# Patient Record
Sex: Female | Born: 1970 | Race: White | Hispanic: No | Marital: Married | Smoking: Never smoker
Health system: Southern US, Community
[De-identification: ages and names within clinical notes are randomized; demographics above are authoritative.]

## PROBLEM LIST (undated history)

## (undated) DIAGNOSIS — R011 Cardiac murmur, unspecified: Secondary | ICD-10-CM

## (undated) DIAGNOSIS — B370 Candidal stomatitis: Secondary | ICD-10-CM

## (undated) DIAGNOSIS — B373 Candidiasis of vulva and vagina: Secondary | ICD-10-CM

## (undated) DIAGNOSIS — B3731 Acute candidiasis of vulva and vagina: Secondary | ICD-10-CM

## (undated) DIAGNOSIS — R05 Cough: Secondary | ICD-10-CM

## (undated) DIAGNOSIS — Z20828 Contact with and (suspected) exposure to other viral communicable diseases: Secondary | ICD-10-CM

## (undated) DIAGNOSIS — B009 Herpesviral infection, unspecified: Secondary | ICD-10-CM

## (undated) DIAGNOSIS — R059 Cough, unspecified: Secondary | ICD-10-CM

## (undated) DIAGNOSIS — J329 Chronic sinusitis, unspecified: Secondary | ICD-10-CM

## (undated) DIAGNOSIS — J310 Chronic rhinitis: Secondary | ICD-10-CM

## (undated) HISTORY — DX: Cough: R05

## (undated) HISTORY — DX: Chronic sinusitis, unspecified: J32.9

## (undated) HISTORY — DX: Candidiasis of vulva and vagina: B37.3

## (undated) HISTORY — DX: Cough, unspecified: R05.9

## (undated) HISTORY — DX: Herpesviral infection, unspecified: B00.9

## (undated) HISTORY — DX: Contact with and (suspected) exposure to other viral communicable diseases: Z20.828

## (undated) HISTORY — DX: Acute candidiasis of vulva and vagina: B37.31

## (undated) HISTORY — DX: Candidal stomatitis: B37.0

## (undated) HISTORY — DX: Cardiac murmur, unspecified: R01.1

## (undated) HISTORY — DX: Chronic rhinitis: J31.0

---

## 1999-02-18 ENCOUNTER — Emergency Department (HOSPITAL_COMMUNITY): Admission: EM | Admit: 1999-02-18 | Discharge: 1999-02-18 | Payer: Self-pay | Admitting: Internal Medicine

## 2000-12-15 ENCOUNTER — Other Ambulatory Visit: Admission: RE | Admit: 2000-12-15 | Discharge: 2000-12-15 | Payer: Self-pay | Admitting: Obstetrics and Gynecology

## 2001-05-16 ENCOUNTER — Ambulatory Visit (HOSPITAL_COMMUNITY): Admission: AD | Admit: 2001-05-16 | Discharge: 2001-05-16 | Payer: Self-pay | Admitting: Obstetrics and Gynecology

## 2001-07-15 ENCOUNTER — Inpatient Hospital Stay (HOSPITAL_COMMUNITY): Admission: AD | Admit: 2001-07-15 | Discharge: 2001-07-15 | Payer: Self-pay | Admitting: Obstetrics and Gynecology

## 2001-07-17 ENCOUNTER — Inpatient Hospital Stay (HOSPITAL_COMMUNITY): Admission: AD | Admit: 2001-07-17 | Discharge: 2001-07-21 | Payer: Self-pay | Admitting: Obstetrics and Gynecology

## 2001-07-17 ENCOUNTER — Encounter (INDEPENDENT_AMBULATORY_CARE_PROVIDER_SITE_OTHER): Payer: Self-pay | Admitting: Specialist

## 2001-07-22 ENCOUNTER — Encounter: Admission: RE | Admit: 2001-07-22 | Discharge: 2001-08-21 | Payer: Self-pay | Admitting: Obstetrics and Gynecology

## 2001-08-17 ENCOUNTER — Other Ambulatory Visit: Admission: RE | Admit: 2001-08-17 | Discharge: 2001-08-17 | Payer: Self-pay | Admitting: Obstetrics and Gynecology

## 2001-09-21 ENCOUNTER — Encounter: Admission: RE | Admit: 2001-09-21 | Discharge: 2001-10-21 | Payer: Self-pay | Admitting: Obstetrics and Gynecology

## 2002-08-31 ENCOUNTER — Other Ambulatory Visit: Admission: RE | Admit: 2002-08-31 | Discharge: 2002-08-31 | Payer: Self-pay | Admitting: Obstetrics and Gynecology

## 2003-02-28 ENCOUNTER — Ambulatory Visit (HOSPITAL_COMMUNITY): Admission: RE | Admit: 2003-02-28 | Discharge: 2003-02-28 | Payer: Self-pay | Admitting: Gynecology

## 2003-03-06 ENCOUNTER — Encounter: Admission: RE | Admit: 2003-03-06 | Discharge: 2003-03-06 | Payer: Self-pay | Admitting: Gynecology

## 2003-05-23 ENCOUNTER — Inpatient Hospital Stay (HOSPITAL_COMMUNITY): Admission: AD | Admit: 2003-05-23 | Discharge: 2003-05-26 | Payer: Self-pay | Admitting: Gynecology

## 2003-06-04 ENCOUNTER — Ambulatory Visit (HOSPITAL_COMMUNITY): Admission: RE | Admit: 2003-06-04 | Discharge: 2003-06-04 | Payer: Self-pay | Admitting: Orthopedic Surgery

## 2003-06-04 ENCOUNTER — Encounter: Payer: Self-pay | Admitting: Gynecology

## 2003-06-28 ENCOUNTER — Other Ambulatory Visit: Admission: RE | Admit: 2003-06-28 | Discharge: 2003-06-28 | Payer: Self-pay | Admitting: Gynecology

## 2003-10-09 ENCOUNTER — Emergency Department (HOSPITAL_COMMUNITY): Admission: EM | Admit: 2003-10-09 | Discharge: 2003-10-09 | Payer: Self-pay | Admitting: Emergency Medicine

## 2003-11-17 HISTORY — PX: NASAL SINUS SURGERY: SHX719

## 2004-03-31 ENCOUNTER — Emergency Department (HOSPITAL_COMMUNITY): Admission: EM | Admit: 2004-03-31 | Discharge: 2004-03-31 | Payer: Self-pay | Admitting: Emergency Medicine

## 2004-05-22 ENCOUNTER — Ambulatory Visit (HOSPITAL_COMMUNITY): Admission: RE | Admit: 2004-05-22 | Discharge: 2004-05-22 | Payer: Self-pay | Admitting: Family Medicine

## 2004-06-30 ENCOUNTER — Encounter: Payer: Self-pay | Admitting: Internal Medicine

## 2004-07-04 ENCOUNTER — Other Ambulatory Visit: Admission: RE | Admit: 2004-07-04 | Discharge: 2004-07-04 | Payer: Self-pay | Admitting: Gynecology

## 2004-08-08 ENCOUNTER — Encounter: Admission: RE | Admit: 2004-08-08 | Discharge: 2004-08-08 | Payer: Self-pay | Admitting: Otolaryngology

## 2005-02-05 ENCOUNTER — Other Ambulatory Visit: Admission: RE | Admit: 2005-02-05 | Discharge: 2005-02-05 | Payer: Self-pay | Admitting: Obstetrics & Gynecology

## 2006-04-27 ENCOUNTER — Ambulatory Visit (HOSPITAL_COMMUNITY): Admission: RE | Admit: 2006-04-27 | Discharge: 2006-04-27 | Payer: Self-pay | Admitting: Family Medicine

## 2006-05-28 ENCOUNTER — Ambulatory Visit (HOSPITAL_COMMUNITY): Admission: RE | Admit: 2006-05-28 | Discharge: 2006-05-28 | Payer: Self-pay | Admitting: Family Medicine

## 2007-08-01 ENCOUNTER — Encounter: Payer: Self-pay | Admitting: Internal Medicine

## 2007-08-17 ENCOUNTER — Encounter: Payer: Self-pay | Admitting: Internal Medicine

## 2007-08-23 ENCOUNTER — Encounter: Payer: Self-pay | Admitting: Internal Medicine

## 2007-08-29 ENCOUNTER — Encounter: Admission: RE | Admit: 2007-08-29 | Discharge: 2007-08-29 | Payer: Self-pay | Admitting: Allergy and Immunology

## 2007-08-29 ENCOUNTER — Encounter: Payer: Self-pay | Admitting: Internal Medicine

## 2007-10-03 ENCOUNTER — Encounter: Payer: Self-pay | Admitting: Internal Medicine

## 2007-10-17 ENCOUNTER — Encounter: Payer: Self-pay | Admitting: Internal Medicine

## 2007-10-20 ENCOUNTER — Encounter: Payer: Self-pay | Admitting: Internal Medicine

## 2007-12-08 ENCOUNTER — Encounter: Payer: Self-pay | Admitting: Internal Medicine

## 2007-12-26 ENCOUNTER — Ambulatory Visit: Payer: Self-pay | Admitting: Internal Medicine

## 2007-12-26 DIAGNOSIS — R011 Cardiac murmur, unspecified: Secondary | ICD-10-CM

## 2007-12-26 DIAGNOSIS — R05 Cough: Secondary | ICD-10-CM

## 2007-12-30 ENCOUNTER — Telehealth: Payer: Self-pay | Admitting: Internal Medicine

## 2008-01-25 ENCOUNTER — Telehealth: Payer: Self-pay | Admitting: Internal Medicine

## 2008-05-21 ENCOUNTER — Telehealth: Payer: Self-pay | Admitting: Internal Medicine

## 2008-06-22 ENCOUNTER — Encounter (INDEPENDENT_AMBULATORY_CARE_PROVIDER_SITE_OTHER): Payer: Self-pay | Admitting: Obstetrics and Gynecology

## 2008-06-22 ENCOUNTER — Ambulatory Visit: Payer: Self-pay | Admitting: Surgery

## 2008-06-22 ENCOUNTER — Ambulatory Visit (HOSPITAL_COMMUNITY): Admission: RE | Admit: 2008-06-22 | Discharge: 2008-06-22 | Payer: Self-pay | Admitting: Obstetrics and Gynecology

## 2008-09-27 ENCOUNTER — Telehealth (INDEPENDENT_AMBULATORY_CARE_PROVIDER_SITE_OTHER): Payer: Self-pay | Admitting: *Deleted

## 2008-10-09 ENCOUNTER — Telehealth (INDEPENDENT_AMBULATORY_CARE_PROVIDER_SITE_OTHER): Payer: Self-pay | Admitting: *Deleted

## 2008-10-25 ENCOUNTER — Ambulatory Visit (HOSPITAL_COMMUNITY): Admission: RE | Admit: 2008-10-25 | Discharge: 2008-10-25 | Payer: Self-pay | Admitting: Internal Medicine

## 2008-10-25 ENCOUNTER — Encounter: Payer: Self-pay | Admitting: Internal Medicine

## 2008-11-13 ENCOUNTER — Encounter: Payer: Self-pay | Admitting: Pulmonary Disease

## 2008-11-19 ENCOUNTER — Telehealth: Payer: Self-pay | Admitting: Internal Medicine

## 2008-11-30 ENCOUNTER — Telehealth (INDEPENDENT_AMBULATORY_CARE_PROVIDER_SITE_OTHER): Payer: Self-pay | Admitting: *Deleted

## 2008-12-03 ENCOUNTER — Ambulatory Visit: Payer: Self-pay | Admitting: Internal Medicine

## 2008-12-03 DIAGNOSIS — J302 Other seasonal allergic rhinitis: Secondary | ICD-10-CM

## 2008-12-03 DIAGNOSIS — J3089 Other allergic rhinitis: Secondary | ICD-10-CM

## 2008-12-04 ENCOUNTER — Telehealth (INDEPENDENT_AMBULATORY_CARE_PROVIDER_SITE_OTHER): Payer: Self-pay | Admitting: *Deleted

## 2008-12-04 DIAGNOSIS — J019 Acute sinusitis, unspecified: Secondary | ICD-10-CM | POA: Insufficient documentation

## 2009-01-23 ENCOUNTER — Telehealth: Payer: Self-pay | Admitting: Internal Medicine

## 2009-01-24 ENCOUNTER — Ambulatory Visit: Payer: Self-pay | Admitting: Internal Medicine

## 2009-02-15 DIAGNOSIS — J329 Chronic sinusitis, unspecified: Secondary | ICD-10-CM | POA: Insufficient documentation

## 2009-02-20 ENCOUNTER — Ambulatory Visit: Payer: Self-pay | Admitting: Internal Medicine

## 2009-02-21 ENCOUNTER — Telehealth (INDEPENDENT_AMBULATORY_CARE_PROVIDER_SITE_OTHER): Payer: Self-pay | Admitting: *Deleted

## 2009-02-25 ENCOUNTER — Ambulatory Visit: Payer: Self-pay | Admitting: Internal Medicine

## 2009-02-27 ENCOUNTER — Telehealth (INDEPENDENT_AMBULATORY_CARE_PROVIDER_SITE_OTHER): Payer: Self-pay | Admitting: *Deleted

## 2009-02-27 ENCOUNTER — Ambulatory Visit: Payer: Self-pay | Admitting: Internal Medicine

## 2009-02-28 ENCOUNTER — Ambulatory Visit: Payer: Self-pay | Admitting: Internal Medicine

## 2009-03-04 ENCOUNTER — Ambulatory Visit: Payer: Self-pay | Admitting: Internal Medicine

## 2009-03-05 ENCOUNTER — Telehealth (INDEPENDENT_AMBULATORY_CARE_PROVIDER_SITE_OTHER): Payer: Self-pay | Admitting: *Deleted

## 2009-03-06 ENCOUNTER — Telehealth (INDEPENDENT_AMBULATORY_CARE_PROVIDER_SITE_OTHER): Payer: Self-pay | Admitting: *Deleted

## 2009-03-08 ENCOUNTER — Telehealth: Payer: Self-pay | Admitting: Internal Medicine

## 2009-03-08 ENCOUNTER — Ambulatory Visit: Payer: Self-pay | Admitting: Internal Medicine

## 2009-03-11 ENCOUNTER — Ambulatory Visit: Payer: Self-pay | Admitting: Internal Medicine

## 2009-03-14 ENCOUNTER — Ambulatory Visit: Payer: Self-pay | Admitting: Internal Medicine

## 2009-03-26 ENCOUNTER — Ambulatory Visit: Payer: Self-pay | Admitting: Internal Medicine

## 2009-03-27 ENCOUNTER — Telehealth: Payer: Self-pay | Admitting: Internal Medicine

## 2009-04-16 ENCOUNTER — Ambulatory Visit: Payer: Self-pay | Admitting: Internal Medicine

## 2009-04-23 ENCOUNTER — Ambulatory Visit: Payer: Self-pay | Admitting: Internal Medicine

## 2009-04-29 ENCOUNTER — Telehealth (INDEPENDENT_AMBULATORY_CARE_PROVIDER_SITE_OTHER): Payer: Self-pay | Admitting: *Deleted

## 2009-05-16 ENCOUNTER — Ambulatory Visit: Payer: Self-pay | Admitting: Internal Medicine

## 2009-06-03 ENCOUNTER — Telehealth (INDEPENDENT_AMBULATORY_CARE_PROVIDER_SITE_OTHER): Payer: Self-pay | Admitting: *Deleted

## 2009-07-18 ENCOUNTER — Telehealth (INDEPENDENT_AMBULATORY_CARE_PROVIDER_SITE_OTHER): Payer: Self-pay | Admitting: *Deleted

## 2009-07-31 ENCOUNTER — Ambulatory Visit: Payer: Self-pay | Admitting: Internal Medicine

## 2009-08-06 ENCOUNTER — Telehealth (INDEPENDENT_AMBULATORY_CARE_PROVIDER_SITE_OTHER): Payer: Self-pay | Admitting: *Deleted

## 2009-08-26 ENCOUNTER — Telehealth: Payer: Self-pay | Admitting: Internal Medicine

## 2009-08-27 ENCOUNTER — Telehealth: Payer: Self-pay | Admitting: Internal Medicine

## 2009-09-16 ENCOUNTER — Telehealth (INDEPENDENT_AMBULATORY_CARE_PROVIDER_SITE_OTHER): Payer: Self-pay | Admitting: *Deleted

## 2009-10-07 ENCOUNTER — Telehealth (INDEPENDENT_AMBULATORY_CARE_PROVIDER_SITE_OTHER): Payer: Self-pay | Admitting: *Deleted

## 2009-11-13 ENCOUNTER — Telehealth (INDEPENDENT_AMBULATORY_CARE_PROVIDER_SITE_OTHER): Payer: Self-pay | Admitting: *Deleted

## 2009-12-02 ENCOUNTER — Telehealth (INDEPENDENT_AMBULATORY_CARE_PROVIDER_SITE_OTHER): Payer: Self-pay | Admitting: *Deleted

## 2009-12-23 ENCOUNTER — Telehealth (INDEPENDENT_AMBULATORY_CARE_PROVIDER_SITE_OTHER): Payer: Self-pay | Admitting: *Deleted

## 2010-02-13 ENCOUNTER — Ambulatory Visit (HOSPITAL_COMMUNITY): Admission: RE | Admit: 2010-02-13 | Discharge: 2010-02-13 | Payer: Self-pay | Admitting: Family Medicine

## 2010-02-25 ENCOUNTER — Ambulatory Visit: Payer: Self-pay | Admitting: Internal Medicine

## 2010-02-26 ENCOUNTER — Ambulatory Visit: Payer: Self-pay | Admitting: Internal Medicine

## 2010-03-19 ENCOUNTER — Telehealth: Payer: Self-pay | Admitting: Internal Medicine

## 2010-03-24 ENCOUNTER — Telehealth: Payer: Self-pay | Admitting: Internal Medicine

## 2010-03-28 ENCOUNTER — Telehealth: Payer: Self-pay | Admitting: Internal Medicine

## 2010-04-02 ENCOUNTER — Telehealth: Payer: Self-pay | Admitting: Internal Medicine

## 2010-04-08 ENCOUNTER — Telehealth (INDEPENDENT_AMBULATORY_CARE_PROVIDER_SITE_OTHER): Payer: Self-pay | Admitting: *Deleted

## 2010-05-09 ENCOUNTER — Telehealth (INDEPENDENT_AMBULATORY_CARE_PROVIDER_SITE_OTHER): Payer: Self-pay | Admitting: *Deleted

## 2010-05-22 ENCOUNTER — Telehealth (INDEPENDENT_AMBULATORY_CARE_PROVIDER_SITE_OTHER): Payer: Self-pay | Admitting: *Deleted

## 2010-06-06 ENCOUNTER — Telehealth (INDEPENDENT_AMBULATORY_CARE_PROVIDER_SITE_OTHER): Payer: Self-pay | Admitting: *Deleted

## 2010-06-16 ENCOUNTER — Ambulatory Visit: Payer: Self-pay | Admitting: Internal Medicine

## 2010-06-19 ENCOUNTER — Ambulatory Visit: Payer: Self-pay | Admitting: Internal Medicine

## 2010-06-20 ENCOUNTER — Telehealth (INDEPENDENT_AMBULATORY_CARE_PROVIDER_SITE_OTHER): Payer: Self-pay | Admitting: *Deleted

## 2010-07-30 ENCOUNTER — Telehealth (INDEPENDENT_AMBULATORY_CARE_PROVIDER_SITE_OTHER): Payer: Self-pay | Admitting: *Deleted

## 2010-11-19 ENCOUNTER — Telehealth: Payer: Self-pay | Admitting: Internal Medicine

## 2010-12-03 ENCOUNTER — Ambulatory Visit: Payer: Self-pay | Admitting: Internal Medicine

## 2010-12-06 ENCOUNTER — Encounter: Payer: Self-pay | Admitting: General Surgery

## 2010-12-07 ENCOUNTER — Encounter: Payer: Self-pay | Admitting: Otolaryngology

## 2010-12-16 NOTE — Progress Notes (Signed)
Summary: yeast infection-LMTCB  Phone Note Call from Patient   Caller: Patient Call For: young Summary of Call: pt have yeast infection would like something called to pharmacy. would also like samples of [facility]var [facility] -  Initial call taken by: Rickard Patience,  Apr 02, 2010 11:18 AM  Follow-up for Phone Call        [facility]Carron Curie CMA  Apr 02, 2010 11:40 AM  pt states she always gets a yeast infection after taking abx and states she has one now from the doxy and augmentin given by CY. Pt requestign rx for this. Please advise Carron Curie CMA  Apr 02, 2010 11:49 AM allergies: walnut, sulfa  Additional Follow-up for Phone Call Additional follow up Details #1::        per CY---ok for pt to have diflucan 150mg   #7  one daily with no refills---this has been sent to pts pharmacy and pt is aware Randell Loop CMA  Apr 02, 2010 2:49 PM     New/Updated Medications: FLUCONAZOLE 150 MG TABS (FLUCONAZOLE) take one tablet by mouth once daily Prescriptions: FLUCONAZOLE 150 MG TABS (FLUCONAZOLE) take one tablet by mouth once daily  #7 x 0   Entered by:   Randell Loop CMA   Authorized by:   Waymon Budge MD   Signed by:   Randell Loop CMA on 04/02/2010   Method used:   Electronically to        [facility] [address] [phone]* (retail)       [address] [place], [place]  [address]       Ph: [phone]       Fax: [phone]   RxID:   [id number]

## 2010-12-16 NOTE — Miscellaneous (Signed)
Summary: Injection record/[facility]  Injection record/[facility]   Imported By: Sherian Rein 04/08/2010 12:21:12  _____________________________________________________________________  External Attachment:    Type:   Image     Comment:   External Document

## 2010-12-16 NOTE — Progress Notes (Signed)
Summary: samples  Phone Note Call from Patient Call back at Home Phone [phone]   Caller: Patient Call For: young Reason for Call: Talk to Nurse Summary of Call: nasanex, astepro, proair or Qvar - Can pt get samples of any of these?  Initial call taken by: Eugene Gavia,  July 30, 2010 11:16 AM  Follow-up for Phone Call        Last OV 8.4.11, Pending OV 10.10.11 Dr. Maple Hudson, pls advise if ok to give samples if available.  Thanks! Gweneth Dimitri RN  July 30, 2010 11:26 AM    Ok to give one of each if we have any; also if patient is having trouble affording meds please suggest assistance programs to her.Reynaldo Minium CMA  July 30, 2010 12:06 PM   Additional Follow-up for Phone Call Additional follow up Details #1::        Per [facility], she does not know of pt assistance program for nasonex and astepro but there is one for proair and qvar.    Called, spoke with pt.  Pt states she does not qualify for pt assistance programs.  Informed her 1 sample of each left at front to pick up.  She verbalized understanding.  Proair: Lot # [id number], Exp Date 09/2011 Qvar 80: Lot # [id number], Exp Date 02/14/2011 Nasonex: Lot # [id number], Exp Date 03/2012 Astepro: Lot # [id number]  Exp Date 03/2011 Additional Follow-up by: Gweneth Dimitri RN,  July 30, 2010 12:18 PM

## 2010-12-16 NOTE — Progress Notes (Signed)
Summary: sample of Qvar  Phone Note Call from Patient Call back at [phone]   Caller: Patient Call For: Young Reason for Call: Talk to Nurse Summary of Call: Sample - Qvar inhaler Initial call taken by: Eugene Gavia,  December 23, 2009 9:14 AM  Follow-up for Phone Call        Patient is aware 1 sample of Qvar 40 left for her to pick up. Michel Bickers [facility]  December 23, 2009 11:59 AM

## 2010-12-16 NOTE — Progress Notes (Signed)
Summary: talk to nurse  Phone Note Call from Patient Call back at Home Phone [phone]   Caller: Patient Call For: young Summary of Call: Wants to know if she could take a steroid pack for her congestion. Initial call taken by: Darletta Moll,  June 20, 2010 8:41 AM  Follow-up for Phone Call        [facility] Vernie Murders  June 20, 2010 8:53 AM   Spoke with pt.  She states that when she was seen yesterday by Dr Maple Hudson, she forgot to ask if he thought maybe a "steroid pack" would help with her symptoms.  Please advise thanks allergic to sulfa Follow-up by: Vernie Murders,  June 20, 2010 8:57 AM  Additional Follow-up for Phone Call Additional follow up Details #1::        I don't advise systemic steroid for this. Additional Follow-up by: Waymon Budge MD,  June 20, 2010 9:39 AM    Additional Follow-up for Phone Call Additional follow up Details #2::    Spoke with pt and notified of recs per Dr Maple Hudson.  Pt verbalized understanding. Follow-up by: Vernie Murders,  June 20, 2010 9:41 AM

## 2010-12-16 NOTE — Miscellaneous (Signed)
Summary: Injection Orders / [facility]    Injection Orders / [facility]    Imported By: Lennie Odor 04/15/2010 15:09:09  _____________________________________________________________________  External Attachment:    Type:   Image     Comment:   External Document

## 2010-12-16 NOTE — Progress Notes (Signed)
Summary: still breathing issues- Increase qvar to 80 mcg  Phone Note Call from Patient Call back at Home Phone [phone]   Caller: Patient Call For: young Summary of Call: pt said she is still not right with her breathing should she come in or just a different med [facility] [place] Initial call taken by: Lacinda Axon,  Apr 08, 2010 11:59 AM  Follow-up for Phone Call        called and spoke with pt.  pt states she is still not feeling 100% better.  Pt states she still has chest congestion and has difficulty coughing up sputum.  Pt states it "collects in the back of her throat" and will occ get up sticky clear to white sputum.  Pt also c/o tightness in chest and states "chest almost feels achy."  Pt states her breathing is not back to its normal baseline.  Pt wanted to know if CY would like to see her for an OV or if she can try a different medication to help with these symptoms.  Please advise.  Thanks.  Aundra Millet Reynolds LPN  Apr 08, 2010 12:49 PM  ALLERGIES:  Vic Blackbird.  Additional Follow-up for Phone Call Additional follow up Details #1::        1) This may be due to Spring pollens lingering, but it could also be a low grade viral tracheobronchitis since she is always around small children. 2) In either case it may help to increase her Qvar from -40 to -80. Please offer to script her for Qvar 80, # 1, 2 puffs and rinse well, twice daily. Ref as needed. 3) The same symptoms may also come from her hiatal hernia with mild recurrent reflux. she was considering surgery for that??? Additional Follow-up by: Waymon Budge MD,  Apr 08, 2010 1:08 PM    Additional Follow-up for Phone Call Additional follow up Details #2::    Spoke with pt and notified of all of the above recs per Dr Maple Hudson.  Pt verbalized understanding and would like to go ahead and try the qvar 80.  Rx was sent to pharm.  She also states that she is considering surgery for [facility] and "this is in the works now".   Follow-up  by: Vernie Murders,  Apr 08, 2010 1:45 PM  New/Updated Medications: QVAR 80 MCG/ACT AERS (BECLOMETHASONE DIPROPIONATE) 2 puffs two times a day and rinse well after each use Prescriptions: QVAR 80 MCG/ACT AERS (BECLOMETHASONE DIPROPIONATE) 2 puffs two times a day and rinse well after each use  #1 x 11   Entered by:   Vernie Murders   Authorized by:   Waymon Budge MD   Signed by:   Vernie Murders on 04/08/2010   Method used:   Electronically to        [facility] [address] [phone]* (retail)       [address], [place]  [address]       Ph: [phone]       Fax: [phone]   RxID:   [id number]

## 2010-12-16 NOTE — Progress Notes (Signed)
Summary: samples  Phone Note Call from Patient   Caller: Patient Call For: young Summary of Call: pt requests samples of QVAR and NASONEX. # to call today is [phone] Initial call taken by: Tivis Ringer, CNA,  May 22, 2010 11:54 AM  Follow-up for Phone Call        1 of each sample was left up front.  Pt aware. Follow-up by: Vernie Murders,  May 22, 2010 2:13 PM

## 2010-12-16 NOTE — Progress Notes (Signed)
Summary: still sick  Phone Note Call from Patient Call back at [facility] Phone [phone]   Caller: Patient Call For: young Reason for Call: Talk to Nurse Summary of Call: pt has one day of doxycycline left, actually feels worse.  Sinus feels packed, thick , colored mucous, sob, cough w/ some plegm.  Feels like it is stuck to her throat.  Chest really tight. [facility] [place] Initial call taken by: Eugene Gavia,  Mar 24, 2010 8:14 AM  Follow-up for Phone Call        Pt states she feels worse since starting abx.  She states she has productive cough and the mucus is now green, chest tightness, and increased SOB. Pt has one day left of doxy course. Please advise. Carron Curie CMA  Mar 24, 2010 8:44 AM allergies: walnut, sulfa  Additional Follow-up for Phone Call Additional follow up Details #1::        Offer augmentin 875 mg, # 14, 1 two times a day after meals. Mucinex Sudafed-PE (otc decongestant) If these don't help she should be seen. Additional Follow-up by: Waymon Budge MD,  Mar 24, 2010 9:07 AM    Additional Follow-up for Phone Call Additional follow up Details #2::    pt advised of recs, rx sent.Carron Curie CMA  Mar 24, 2010 9:13 AM   New/Updated Medications: AUGMENTIN 875-125 MG TABS (AMOXICILLIN-POT CLAVULANATE) Take 1 tablet by mouth two times a day Prescriptions: AUGMENTIN 875-125 MG TABS (AMOXICILLIN-POT CLAVULANATE) Take 1 tablet by mouth two times a day  #14 x 0   Entered by:   Carron Curie CMA   Authorized by:   Waymon Budge MD   Signed by:   Carron Curie CMA on 03/24/2010   Method used:   Electronically to        [facility] [address] [phone]* (retail)       [address], [place]  [address]       Ph: [phone]       Fax: [phone]   RxID:   [id number]

## 2010-12-16 NOTE — Progress Notes (Signed)
Summary: samples req  Phone Note Call from Patient Call back at Home Phone [phone]   Caller: Patient Call For: young Summary of Call: pt requests samples of: qvar, nasonex or astepro nasal spray, and proair.  Initial call taken by: Tivis Ringer, CNA,  Mar 28, 2010 12:22 PM  Follow-up for Phone Call        only samples available are of astepro so i left a smple at front for pt. pt aware.Carron Curie CMA  Mar 28, 2010 12:48 PM

## 2010-12-16 NOTE — Progress Notes (Signed)
Summary: nasal drainage  Phone Note Call from Patient   Caller: Patient Call For: young Summary of Call: has green nasal drainage think she need antibiotic pharmacy [facility] [place] Initial call taken by: Rickard Patience,  March 06, 2009 9:15 AM  Follow-up for Phone Call        Pt came to pick up Qvar this am.  Pt thinks she needs abx d/t green drainage.  Please advise Follow-up by: Cloyde Reams RN,  March 06, 2009 9:22 AM  Additional Follow-up for Phone Call Additional follow up Details #1::        Please offer Amoxacillin 500 mg, # 21, 1 three times a day  Additional Follow-up by: Waymon Budge MD,  March 06, 2009 2:22 PM    Additional Follow-up for Phone Call Additional follow up Details #2::    Spoke with pt and advised rx for abx will be sent to pharmacy.  Follow-up by: Vernie Murders,  March 06, 2009 2:40 PM  New/Updated Medications: AMOXICILLIN 500 MG CAPS (AMOXICILLIN) 1 three times a day   Prescriptions: AMOXICILLIN 500 MG CAPS (AMOXICILLIN) 1 three times a day  #21 x 0   Entered by:   Vernie Murders   Authorized by:   Waymon Budge MD   Signed by:   Vernie Murders on 03/06/2009   Method used:   Electronically to        [facility] [address] [phone]* (retail)       [address] [place]  [address]       Ph: [phone]       Fax: [phone]   RxID:   [id number]

## 2010-12-16 NOTE — Progress Notes (Signed)
Summary: samples  Phone Note Call from Patient Call back at Home Phone [phone]   Caller: Patient Call For: YOUNG Summary of Call: pt requests samples of QVAR, ASTEPRO NASEL SPRAY and NASONEX. Pt also requests to have a med list sent to her so that she can get refills of her meds through mail-order.  Initial call taken by: Tivis Ringer, CNA,  May 09, 2010 1:32 PM  Follow-up for Phone Call        lmomtcb to find out what mail order pharmacy she is going to use.  Randell Loop CMA  May 09, 2010 2:18 PM   Returning call.Darletta Moll  May 09, 2010 3:17 PM  called and spoke with pt and she stated that she is changing her pharmacy--will be using pharmacy through her insurance--[employer]---requesting to pick up these rx--Cy is this ok to print these out for her?  her last appt with you was 02/2010  please advise. Randell Loop CMA  May 09, 2010 3:25 PM    Additional Follow-up for Phone Call Additional follow up Details #1::        PER CDY-ok to give.Reynaldo Minium CMA  May 09, 2010 3:49 PM   rx printed out and waiting for CY to sign these rx and will call pt once they are ready. Randell Loop CMA  May 09, 2010 4:02 PM     Additional Follow-up for Phone Call Additional follow up Details #2::    Pt aware that RX's are at front for pick up .Reynaldo Minium CMA  May 12, 2010 10:08 AM   New/Updated Medications: NASONEX 50 MCG/ACT SUSP (MOMETASONE FUROATE) 2 sprays each nostril once daily Prescriptions: NASONEX 50 MCG/ACT SUSP (MOMETASONE FUROATE) 2 sprays each nostril once daily  #3 x 3   Entered by:   Randell Loop CMA   Authorized by:   Waymon Budge MD   Signed by:   Randell Loop CMA on 05/09/2010   Method used:   Print then Give to Patient   RxID:   [id number] QVAR 80 MCG/ACT AERS (BECLOMETHASONE DIPROPIONATE) 2 puffs two times a day and rinse well after each use  #3 x 3   Entered by:   Randell Loop CMA   Authorized by:   Waymon Budge MD   Signed by:   Randell Loop  CMA on 05/09/2010   Method used:   Print then Give to Patient   RxID:   [id number] ASTEPRO 0.15 % SOLN (AZELASTINE HCL) 2 puffs at bedtime  #3 x 3   Entered by:   Randell Loop CMA   Authorized by:   Waymon Budge MD   Signed by:   Randell Loop CMA on 05/09/2010   Method used:   Print then Give to Patient   RxID:   [id number]

## 2010-12-16 NOTE — Progress Notes (Signed)
Summary: samples  Phone Note Call from Patient Call back at Home Phone [phone]   Caller: Patient Call For: young Summary of Call: pt requests samples of : nasonex, astepro, proair, qvar (any or all).  Initial call taken by: Tivis Ringer, CNA,  June 06, 2010 4:22 PM  Follow-up for Phone Call        1 sample each of proventil, qvar and astepro left up front for pt.  LM on named VM that samples are ready for her to pick up at her convenience. Boone Master CNA/MA  June 06, 2010 4:36 PM

## 2010-12-16 NOTE — Miscellaneous (Signed)
Summary: Injection Orders / [facility]    Injection Orders / [facility]    Imported By: Lennie Odor 04/15/2010 16:10:39  _____________________________________________________________________  External Attachment:    Type:   Image     Comment:   External Document

## 2010-12-16 NOTE — Assessment & Plan Note (Signed)
Summary: rov/ mbw   Copy to:  Stevphen Rochester Primary Provider/Referring Provider:  Dr. Dustin Folks Gerda Diss  CC:  Follow up visit.  History of Present Illness:  02/20/09- Allergic rhinitis, hx rhinosinusitis, cough For allergy skin testing. Has recognized increased postasal drip and nasal congestion in the last 3 weeks with the heavy pollen season. She says she and her husband have been discussing her previous experience with medications and she comes with 2 of her small children in tow to see what current profile looks like.   02/28/09-                                                          Children with her Thinks sinusitis. Caught cold after last here. Much drainage, feels irritated nose and throat, some headache, ears full without pain, may be low grade fever and chilliness. Stomach ok, no swollen glands. Does saline lavage several times weekly. Not nursing.  04/23/09- Allergic rhinitis, rhinosinusitis          4 children with her. Allergy vaccine building without problems. Anticipates need for hernia repair. Still feels head cong sinus drip, ear pressure. nasal discharge occasionally greenish. sometimes nasal tickle. Meds reviewed. She is also doing Neti pot. Dr Gerda Diss gave Augmentin ending 4 days ago.- 10 days.  February 25, 2010- Allergic rhinitis, rhinosinusitis Needs to reorder allergy vaccine. Had held at 0.1 ml/ vial after ? minor local reaction.Saw ENT in [place] who did in vitro test finding little specific IgE..I compared in vitro to skin tests. She feels vaccine has helped her. Sleeping with window open associated with a little increased airway mucus now.  Asks ok to run outside in polen. She had CXR with her mamogram and was told it showed some bronchitis change.    Current Medications (verified): 1)  Zyrtec Allergy 10 Mg  Tabs (Cetirizine Hcl) .... Once Daily 2)  Singulair 10 Mg  Tabs (Montelukast Sodium) .... Once Daily 3)  Astepro 0.15 % Soln (Azelastine Hcl) .... 2 Puffs At  Bedtime 4)  Allergy Vaccine Go (W-E) 1:10 5)  Epipen 0.3 Mg/0.60ml (1:1000) Devi (Epinephrine Hcl (Anaphylaxis)) .... For Severe Allergic Reaction 6)  Qvar 40 Mcg/act Aers (Beclomethasone Dipropionate) .... 2 Puffs and Rinse Twice Daily 7)  Proventil Hfa 108 (90 Base) Mcg/act  Aers (Albuterol Sulfate) .Marland Kitchen.. 1-2 Puffs Every 4-6 Hours As Needed 8)  Famvir 250 Mg  Tabs (Famciclovir) .... Two Times A Day 9)  Mucinex 600 Mg  Tb12 (Guaifenesin) .Marland Kitchen.. 1-2 Every Day As Needed 10)  Multivitamins  Tabs (Multiple Vitamin) .... Take 1 By Mouth Once Daily 11)  Nexium 40 Mg  Cpdr (Esomeprazole Magnesium) .... Once Daily 12)  Cefdinir 300 Mg Caps (Cefdinir) .... 2 Daily 13)  Amoxicillin-Pot Clavulanate 875-125 Mg Tabs (Amoxicillin-Pot Clavulanate) .Marland Kitchen.. 1 Two Times A Day  Allergies (verified): 1)  ! Sulfa 2)  ! Rayvon Char  Past History:  Past Medical History: Last updated: 12/26/2007 REviewd old chart through 2004.   History of HSV with outbreak during pregnancy 2004. 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 Married.  Never smoked. Works out a lot at home. Denies roach  exposure at home exercises, body building.   Risk Factors: Smoking Status: never (12/26/2007)  Review of Systems      See HPI  The patient denies anorexia, fever, weight loss, weight gain, vision loss, decreased hearing, hoarseness, chest pain, syncope, dyspnea on exertion, peripheral edema, prolonged cough, headaches, hemoptysis, and severe indigestion/heartburn.    Vital Signs:  Patient profile:   40 year old female Height:      61 inches Weight:      123.25  pounds BMI:     23.37 O2 Sat:      100 % on Room air Pulse rate:   70 / minute BP sitting:   120 / 82  (left arm)  Vitals Entered By: Reynaldo Minium CMA (February 25, 2010 9:50 AM)  O2 Flow:  Room air  Physical Exam  Additional Exam:  GEN: A/Ox3; pleasant , NAD, muscular HEENT:  [place]/AT, , EACs-clear, some cerumen, TMs-wnl, NOSE-pale, THROAT-clear, Mallampatii II, not nasal NECK:  Supple w/ fair ROM; no JVD; normal carotid impulses w/o bruits; no thyromegaly or nodules palpated; no lymphadenopathy. CHEST:  Clear to P & A; w/o wheezes/ rales/ or rhonchi. HEART:  RRR,no murmur heard today.   ABDOMEN:  EXT: Warm bil,  no calf pain, edema, clubbing, pulses intact Neuro: no abnormality eo observation.     Impression & Recommendations:  Problem # 1:  ALLERGIC RHINITIS (ICD-477.9)  Pretty good control. There is some rhinitis with the current very high pollen counts. I will let her allergy vaccine dose build if tolerated. I discussed IgE and compared allergiuc to viral and irritant triggers. Her updated medication list for this problem includes:    Zyrtec Allergy 10 Mg Tabs (Cetirizine hcl) ..... Once daily    Astepro 0.15 % Soln (Azelastine hcl) .Marland Kitchen... 2 puffs at bedtime  Problem # 2:  COUGH (ICD-786.2) We discussed running with current pollen levels as she continues her exercise program. She can pretreat with Proair if needed. Mild asthmatic bronchitis. She will continue present meds.  Other Orders: Est. Patient Level III ([address])  Patient Instructions: 1)  Please schedule a follow-up appointment in 6 months. 2)  Try increasing allergy vaccine by 0.64ml / vial/ week as tolerated, up to a maximum of 0.5 ml/ vial / week.

## 2010-12-16 NOTE — Assessment & Plan Note (Signed)
Summary: CONGESTION///KP   Copy to:  Stevphen Rochester Primary Provider/Referring Provider:  Dr. Dustin Folks Gerda Diss  CC:  Accute visit-congestion in chest mainly but slight head congestion-not getting any relief..  History of Present Illness:  02/28/09-                                                          Children with her Thinks sinusitis. Caught cold after last here. Much drainage, feels irritated nose and throat, some headache, ears full without pain, may be low grade fever and chilliness. Stomach ok, no swollen glands. Does saline lavage several times weekly. Not nursing.  04/23/09- Allergic rhinitis, rhinosinusitis          4 children with her. Allergy vaccine building without problems. Anticipates need for hernia repair. Still feels head cong sinus drip, ear pressure. nasal discharge occasionally greenish. sometimes nasal tickle. Meds reviewed. She is also doing Neti pot. Dr Gerda Diss gave Augmentin ending 4 days ago.- 10 days.  February 25, 2010- Allergic rhinitis, rhinosinusitis Needs to reorder allergy vaccine. Had held at 0.1 ml/ vial after ? minor local reaction.Saw ENT in [place] who did in vitro test finding little specific IgE..I compared in vitro to skin tests. She feels vaccine has helped her. Sleeping with window open associated with a little increased airway mucus now.  Asks ok to run outside in [place]. She had CXR with her mamogram and was told it showed some bronchitis change.  June 19, 2010- Allergic rhinitis, rhinitis.............................4 active small children here with her 2 months of mild persistent throat/ upper chest congestion with occasional cough, no wheeze or chest pain, fever or purulence., scant mucus is sometimes yellow. Wears nasal strips most of the time. We discussed air quality. She has seen Dr Andrey Campanile ENT in [place] with no specific intervention.. Considering having another child- has 4 here today. Continues lifting weights.    Preventive Screening-Counseling  & Management  Alcohol-Tobacco     Smoking Status: never  Current Medications (verified): 1)  Zyrtec Allergy 10 Mg  Tabs (Cetirizine Hcl) .... Once Daily 2)  Singulair 10 Mg  Tabs (Montelukast Sodium) .... Once Daily 3)  Astepro 0.15 % Soln (Azelastine Hcl) .... 2 Puffs At Bedtime 4)  Allergy Vaccine Go (W-E) 1:10 5)  Epipen 0.3 Mg/0.25ml (1:1000) Devi (Epinephrine Hcl (Anaphylaxis)) .... For Severe Allergic Reaction 6)  Qvar 80 Mcg/act Aers (Beclomethasone Dipropionate) .... 2 Puffs Two Times A Day and Rinse Well After Each Use 7)  Proventil Hfa 108 (90 Base) Mcg/act  Aers (Albuterol Sulfate) .Marland Kitchen.. 1-2 Puffs Every 4-6 Hours As Needed 8)  Famvir 250 Mg  Tabs (Famciclovir) .... Two Times A Day 9)  Mucinex 600 Mg  Tb12 (Guaifenesin) .Marland Kitchen.. 1-2 Every Day As Needed 10)  Multivitamins  Tabs (Multiple Vitamin) .... Take 1 By Mouth Once Daily 11)  Nexium 40 Mg  Cpdr (Esomeprazole Magnesium) .... Once Daily 12)  Cefdinir 300 Mg Caps (Cefdinir) .... 2 Daily 13)  Amoxicillin-Pot Clavulanate 875-125 Mg Tabs (Amoxicillin-Pot Clavulanate) .Marland Kitchen.. 1 Two Times A Day 14)  Augmentin 875-125 Mg Tabs (Amoxicillin-Pot Clavulanate) .... Take 1 Tablet By Mouth Two Times A Day 15)  Fluconazole 150 Mg Tabs (Fluconazole) .... Take One Tablet By Mouth Once Daily 16)  Nasonex 50 Mcg/act Susp (Mometasone Furoate) .... 2 Sprays Each Nostril  Once Daily  Allergies (verified): 1)  ! Sulfa 2)  ! 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  Never smoked. Works out a lot at home. Denies roach  exposure at home exercises, body building.   Risk Factors: Smoking Status: never (06/19/2010)  Past Medical History: Rhinosinusitis,  recurrent Allergic rhinitis Cough Cardiac murmur  History of HSV with outbreak during pregnancy 2004. 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  Never smoked. Works out a lot at home. Denies roach  exposure at home exercises, body building.   Review of Systems      See HPI       The patient complains of productive cough and nasal congestion/difficulty breathing through nose.  The patient denies shortness of breath with activity, shortness of breath at rest, non-productive cough, coughing up blood, chest pain, irregular heartbeats, acid heartburn, indigestion, loss of appetite, weight change, abdominal pain, difficulty swallowing, sore throat, tooth/dental problems, headaches, sneezing, itching, ear ache, hand/feet swelling, rash, and fever.    Vital Signs:  Patient profile:   40 year old female Height:      61 inches Weight:      123.13 pounds BMI:     23.35 O2 Sat:      98 % on Room air Pulse rate:   103 / minute BP sitting:   122 / 64  (right arm) Cuff size:   regular  Vitals Entered By: Reynaldo Minium CMA (June 19, 2010 10:17 AM)  O2 Flow:  Room air CC: Accute visit-congestion in chest mainly but slight head congestion-not getting any relief.   Physical Exam  Additional Exam:  GEN: A/Ox3; pleasant , NAD, muscular HEENT:  [place]/AT, , EACs-clear, some cerumen, TMs-wnl, NOSE-pale, THROAT-clear, Mallampatii II, not nasal NECK:  Supple w/ fair ROM; no JVD; normal carotid impulses w/o bruits; no thyromegaly or nodules palpated; no lymphadenopathy. CHEST:  Clear to P & A; w/o wheezes/ rales/ or rhonchi. HEART:  RRR,no murmur heard today.   ABDOMEN:  EXT: Warm bil, edema, clubbing, pulses intact Neuro: no abnormality eo observation.     Impression & Recommendations:  Problem # 1:  ALLERGIC RHINITIS (ICD-477.9)  Continues vaccine. Air quality is a problem now but will  will pass soon. She will try reducing Qvar a little to see if it is irritiating her airway. I don't think she needs another antibiotic or steroid taper now. Her updated medication list for this problem includes:    Zyrtec Allergy 10 Mg Tabs (Cetirizine hcl) ..... Once daily    Astepro 0.15 % Soln (Azelastine hcl) .Marland Kitchen... 2 puffs at bedtime    Nasonex 50 Mcg/act Susp (Mometasone furoate) .Marland Kitchen... 2 sprays each nostril once daily  Problem # 2:  COUGH (ICD-786.2) Very mild, nonspecific tracheobronchitis or minimal asthma. Comments as above about Qvar. We may want to stop it if a dose reduction makes no difference.  Other Orders: Est. Patient Level III (16109)  Patient Instructions: 1)  Please schedule a follow-up appointment in 1 year. 2)  Continue allergy vaccine 3)  Try reducing your Qvar to 1 puff, twice daily, to see if that affects the way your airways feel. 4)  Call as needed

## 2010-12-16 NOTE — Progress Notes (Signed)
Summary: copy of test  Phone Note Call from Patient Call back at Home Phone [phone]   Caller: Patient Call For: young Summary of Call: pt wants a copy of her allergy skin test (from apri/ 2010) mailed to her address. this should have already been scanned in per [place].  Initial call taken by: Tivis Ringer,  December 02, 2009 4:31 PM  Follow-up for Phone Call        mailed 01/17//Juanita

## 2010-12-16 NOTE — Progress Notes (Signed)
Summary: SINUS/ COUGH  Phone Note Call from Patient   Caller: Patient Call For: [name] [name] Summary of Call: PT HAVE SINUS PROBLEM AND COUGH QVAR NOT ENOUGH WOULD LIKE TO NO WHAT ELSE SHE CAN TAKE [facility] Initial call taken by: Rickard Patience,  Mar 19, 2010 8:58 AM  Follow-up for Phone Call        [facility].Michel Bickers CMA  Mar 19, 2010 9:31 AM  The patient c/o increased coughing spells and yellowish nasal drainage. Increased sob and  cough is prod at times with thick white mucus. Please advise.Michel Bickers CMA  Mar 19, 2010 10:28 AM Allergies (verified):  1)  ! Sulfa 2)  ! [facility]    Additional Follow-up for Phone Call Additional follow up Details #1::        Per Dr Maple Hudson, call in doxycycline 100 mg # 8- 2 today, then 1 once daily.  Also get delsym and loratidine-d 12 hr 1 two times a day as needed.  ATC pt and had to [facility].  Additional Follow-up by: Vernie Murders,  Mar 19, 2010 3:52 PM    Additional Follow-up for Phone Call Additional follow up Details #2::    pt advised of recs. Carron Curie CMA  Mar 20, 2010 9:36 AM   New/Updated Medications: DOXYCYCLINE HYCLATE 100 MG CAPS (DOXYCYCLINE HYCLATE) take 2 today, then 1 daily Prescriptions: DOXYCYCLINE HYCLATE 100 MG CAPS (DOXYCYCLINE HYCLATE) take 2 today, then 1 daily  #8 x 0   Entered by:   Vernie Murders   Authorized by:   Waymon Budge MD   Signed by:   Vernie Murders on 03/19/2010   Method used:   Electronically to        [facility] [address] [phone]* (retail)       [address], [place]  [address]       Ph: [phone]       Fax: [phone]   RxID:   [id number]

## 2010-12-17 ENCOUNTER — Telehealth: Payer: Self-pay | Admitting: Internal Medicine

## 2010-12-18 NOTE — Progress Notes (Signed)
Summary: samples  Phone Note Call from Patient Call back at Home Phone [phone]   Caller: Patient Call For: Zenon Leaf Summary of Call: wants samples of qvar, nasonex and proair Initial call taken by: Tivis Ringer, CNA,  November 19, 2010 1:37 PM  Follow-up for Phone Call        [facility]. Does patient still have ins?Michel Bickers CMA  November 19, 2010 4:44 PM  1 box each of Qvar 80, Nasonex, and Proventil left at front desk, pt aware. Zackery Barefoot CMA  November 20, 2010 10:32 AM

## 2010-12-24 NOTE — Progress Notes (Signed)
Summary: b12 shots  Phone Note Call from Patient Call back at Home Phone [phone]   Caller: Patient Call For: Alantra Popoca Reason for Call: Talk to Nurse Summary of Call: Patient wanted to start B-12 shots.  Asking if Dr. Maple Hudson will place order. Initial call taken by: Lehman Prom,  December 17, 2010 8:41 AM  Follow-up for Phone Call        Spoke with Florentina Addison and she recs pt check with PCP first for this. Pt advised and she will call PCP. Carron Curie CMA  December 17, 2010 9:41 AM

## 2011-01-26 ENCOUNTER — Other Ambulatory Visit (HOSPITAL_COMMUNITY): Payer: Self-pay | Admitting: Physician Assistant

## 2011-01-26 DIAGNOSIS — Z139 Encounter for screening, unspecified: Secondary | ICD-10-CM

## 2011-02-16 ENCOUNTER — Ambulatory Visit (HOSPITAL_COMMUNITY)
Admission: RE | Admit: 2011-02-16 | Discharge: 2011-02-16 | Disposition: A | Discharge status: Home / Self Care | Payer: Managed Care, Other (non HMO) | Source: Ambulatory Visit | Attending: Physician Assistant | Admitting: Physician Assistant

## 2011-02-16 DIAGNOSIS — Z1231 Encounter for screening mammogram for malignant neoplasm of breast: Secondary | ICD-10-CM | POA: Insufficient documentation

## 2011-02-16 DIAGNOSIS — Z139 Encounter for screening, unspecified: Secondary | ICD-10-CM

## 2011-03-25 ENCOUNTER — Ambulatory Visit (INDEPENDENT_AMBULATORY_CARE_PROVIDER_SITE_OTHER): Payer: Managed Care, Other (non HMO)

## 2011-03-25 DIAGNOSIS — J309 Allergic rhinitis, unspecified: Secondary | ICD-10-CM

## 2011-04-03 NOTE — Discharge Summary (Signed)
[facility]  Patient:    [name] [name], [name] [name] Visit Number: [id number] MRN: [id number]          Service Type: OBS Location:910A 9113 01 Attending Physician:  Maxie Better Dictated OZ:[id number] A. Cherly Hensen, M.D. Admit Date:  07/17/2001 DischargeDate: 07/21/2001                             Discharge Summary  ADMISSION DIAGNOSES:          1. Prolongedrupture of membranes.                               2. Term gestation.  DISCHARGE DIAGNOSES:          1. Term gestation, delivered.                        2. Arrest of descent.   3. Iron deficiency anemia.                               4. Mild acute chorioamnionitis.  HISTORY OF PRESENT ILLNESS:   This is a 40 year old, gravida 2, para 0-0-1-0, female at term with spontaneous rupture of membranes. The patient had been seen two days previously, was evaluated and thought to be no evidence of ruptured membranes. The patient presented with complaints of continuous leakage of fluid for two days. She has a history of herpes without any recent outbreak. Group B strep culture was negative. Her prenatal course was notable for bilateral fetal pyelectasis, otherwise unremarkable obstetrical course.  HOSPITAL COURSE:  The patient was admitted. At the time that she presented she was afebrile. Her exam was notable for her cervix being 1.5 cm, 70%, -2, vertex. Her fetal monitoring revealed a reactive nonstress test with rare contractions. Given the uncertainty of the specific time of rupture of membranes,the patient was admitted. Routine labs were obtained and the decision was made for Pitocin induction. The patient subsequently had an epidural placed after labor. She had bulging membranes at the time when she was 4 cm, 100%, and -2. The fluid was ruptured, meconium filled with particulate matter. Intrauterine pressure catheter placed at that time, and Pitocin was continued, and amnioinfusion began. The patient  subsequently progressed to full dilatation. She pushed for about two hours. The vertex never descended below 0 station. It was a left occiput transverse presentation, thought to be a transverse arrest, and the decision was made to proceed with a primary cesarean section. The patient underwent a primary cesarean section on July 18, 2001 with the finding of a 7-pound 11-ounce live female in the right occiput transverse position. Apgars were 9 and 9. Normaltubes and ovaries were noted. The placenta was sent to pathology which subsequently revealed the diagnosis of a mild acute chorioamnionitis. However, her postoperative course was unremarkable.  The patient remainedafebrile throughout her course. Her CBC on postoperative day #1 showed ahemoglobin of 10.9, hematocrit of 31. As a result of her prolonged rupture of membranes, the patient was placed on antibiotics which was continued for 24 hours postoperatively. On postoperative day #2 the patient was complaining of some dizziness and some bruising. A CBC was redone that showed a hemoglobin of 7.1 and hematocrit of 20.5. Her platelet count was 119,000. CBC was redone and on postoperative day #3, it showed a hemoglobin of  7.6, and the patient was complaining of some headache. Her blood pressure was notable to be 130-150/70-92. PIH labs had been done on July 18, 2001 that showed an AST of 84, ALT of 85, uric acid of 5.8. Repeat on July 20, 2001 showed an AST of 36, ALT of 53, uric acid of 6.2. Her CBC showed a hemoglobin of 7.6, hematocrit of 22.3, platelet count was 155,000. The patient was still deemed well to be discharged home on postoperative day #3.  DISPOSITION:                  Home.  CONDITION:                  Stable.  DISCHARGE MEDICATIONS:        1. Tylox, #30, one p.o. q.4h. p.r.n. pain.                               2. ChromagenForte one p.o. b.i.d.                               3. Prenatal vitaminsone p.o.  q.d.  DISCHARGE FOLLOWUP:           The patient is to follow up with an appointment for blood pressure check on Monday, September 9,in the office and postpartum checkup in four weeks.  DISCHARGE INSTRUCTIONS:       The patient is to call for temperature greater than orequal to 100.4, nothing per vagina for four to six weeks, no heavy lifting or driving for two weeks. She is to call with severe abdominal pain, nausea or vomiting, soaking a regular pad every hour or more frequently, visual changes, headache that does not respond to Tylenol, and/or heartburn not responding to Tums. Dictated by:   Sheria Lang. Cherly Hensen, M.D. Attending Physician:  Maxie Better DD:  07/18/01 TD:  08/11/01 Job: [id number]

## 2011-04-03 NOTE — Op Note (Signed)
[facility]  Patient:    [name] [name], [name] [name] Visit Number: [id number] MRN: [id number]          Service Type: OBS Location: 910A 9113 01 Attending Physician:  Maxie Better Dictated by:   Sheria Lang. Cherly Hensen, M.D. Proc. Date: 07/18/01 Admit Date:  07/17/2001                             Operative Report  PREOPERATIVE DIAGNOSIS:       Arrest of descent.  POSTOPERATIVE DIAGNOSES:      1. Arrest of descent.                               2. Right occiput transverse presentation.  OPERATION/PROCEDURE:          1. Primary cesarean section.                               2. Sharl Ma hysterotomy.  SURGEON:                      Sheronette A. Cherly Hensen, M.D.  ANESTHESIA:                   Epidural.  DESCRIPTION OF PROCEDURE:     Under adequate epidural anesthesia the patient was placed in the supine position with left lateral tilt.  An indwelling Foley catheter had been placed.  She had been draining blood-tinged urine.  The patient was sterilely prepped and draped in the usual fashion and a marking pen used to outline the planned Pfannenstiel incision.  Marcaine 0.25% was injected along this imaginary incision line and a Pfannenstiel skin incision then made and carried down to the rectus fascia using Bovie cautery.  The rectus fascia was incised in the midline and the incision extended bilaterally using Mayo scissors.  The rectus fascia then was bluntly and sharply dissected off the rectus muscles in superior and inferior fashion.  The rectus muscles were split in the midline and the parietal peritoneum entered bluntly and the incision extended superiorly and inferiorly.  The vesicouterine peritoneum was then opened and the incision extended bilaterally.  The bladder was then bluntly dissected off the lower uterine segment and displaced from the operative field using a bladder retractor.  A curvilinear low transverse uterine incision was then made and  extended bilaterally using bandage scissors with subsequent delivery of a live female infant from the right occiput transverse position accomplished.  The baby was bulb suction DeLee suctioned on the abdomen and the cord then clamped and cut.  The baby was transferred to the awaiting pediatricians, who assigned Apgars of nine and nine at one and five minutes.  Weight of the baby ultimately was 7 pounds 11 ounces.  The uterus was massaged and the placenta delivered spontaneously intact and removed.  The uterine cavity was cleaned of debris and uterine incision closed in two layers, the first layer with 0 Monocryl in running locked stitch and the second layer imbricated using 0 Monocryl suture.  A small area bleeder on the right was made hemostatic using 0 Monocryl figure-of-eight suture.  Normal tubes and ovaries were noted bilaterally.  The abdomen was irrigated and suctioned of debris and reinspection of the incision site showed some small bleeding along the peritoneal edge inferiorly.  This was made  hemostatic using cautery.  With good hemostasis subsequently noted the vesicouterine peritoneum and parietal peritoneum were not closed.  The outer surface of the rectus fascia was inspected superiorly and inferiorly and muscle inspected, and small bleeders cauterized.  The rectus fascia was closed with 0 Vicryl stitch x 2. The subcutaneous area was irrigated and small bleeders cauterized.  The skin was approximated using Ethicon staples.  SPECIMEN:                     Placenta, sent to pathology.  ESTIMATED BLOOD LOSS:         Estimated blood loss was 750 cc.  INTRAOPERATIVE FLUID:         One liter.  URINE OUTPUT:                 Urine output was 450 cc including that which was transferred from the labor and delivery suite.  COUNTS:                       Sponge and instrument counts x 3 were correct.  COMPLICATIONS:                None.  DISPOSITION:                  The patient  tolerated the procedure well and was transferred to the recovery room in stable condition. Dictated by:   Sheria Lang. Cherly Hensen, M.D. Attending Physician:  Maxie Better DD:  07/19/01 TD:  07/19/01 Job: [id number]

## 2011-04-03 NOTE — H&P (Signed)
   NAME:  [name] [name], [name] [name]                            ACCOUNT NO.:  [id number]   MEDICAL RECORD NO.:  [id number]                   PATIENT TYPE:  INP   LOCATION:  NA                                   FACILITY:  [facility]   PHYSICIAN:  Timothy P. Fontaine, M.D.           DATE OF BIRTH:  09-19-1971   DATE OF ADMISSION:  05/23/2003  DATE OF DISCHARGE:                                HISTORY & PHYSICAL   CHIEF COMPLAINT:  1. Pregnancy at term.  2. Prior Cesarean section, desires repeat Cesarean section.  3. History of herpes simplex virus, type 2.  4. Gestational diabetes, diet controlled.   HISTORY OF PRESENT ILLNESS:  This is a 40 year old, G2, P62, female at [redacted]  weeks gestation. History of prior cesarean section, gestational diabetes  diet controlled with normal antepartum testing. History of HSV with outbreak  during pregnancy.  She desires repeat cesarean section after appropriate  counseling for trial of labor.  The risks, benefits, indications and  alternatives for repeat cesarean section were reviewed with the patient to  include bleeding, transfusion, infection, prolonged antibiotics, abscess  formation and drainage, incision complication requiring opening and draining  of incisions, closure by secondary intentions, injury to internal organs  including bowel, bladder, ureters, vessels and nerves necessitating major  explorative reparative surgeries and future reparative surgeries including  ostomy formation. The risks of fetal injury were also discussed during the  birthing process, understood and accepted. The patient's questions were  answered and she was ready to proceed with surgery. For the remainder of her  history, see [place].   PHYSICAL EXAMINATION:  VITAL SIGNS:  Afebrile. Vital signs are stable.  HEENT:  Normal.  LUNGS:  Clear.  CARDIAC:  Regular rate. No rubs, murmurs, or gallops.  ABDOMEN:  Examination consistent with term pregnancy. Positive fetal heart  tones.  PELVIC:   Deferred.   ASSESSMENT:  This is a 40 year old, G2, P17, female, prior cesarean section,  desires repeat cesarean section after counseling for trial of labor.                                               Timothy P. Audie Box, M.D.    TPF/MEDQ  D:  05/22/2003  T:  05/22/2003  Job:  [id number]

## 2011-04-03 NOTE — Discharge Summary (Signed)
   NAME:  [name] [name], [name] [name]                            ACCOUNT NO.:  [id number]   MEDICAL RECORD NO.:  [id number]                   PATIENT TYPE:  INP   LOCATION:  9107                                 FACILITY:  [facility]   PHYSICIAN:  Juan H. Lily [name], M.D.             DATE OF BIRTH:  16-Jun-1971   DATE OF ADMISSION:  05/23/2003  DATE OF DISCHARGE:  05/26/2003                                 DISCHARGE SUMMARY   DISCHARGE DIAGNOSES:  1. Intrauterine pregnancy at term.  2. Repeat cesarean section.  3. History for gestational diabetes, diet controlled.   PROCEDURE:  Repeat cesarean section.   HISTORY OF PRESENT ILLNESS:  A 40 year old gravida 2, para 1 at [redacted] weeks  gestation, history of prior cesarean section, gestational diabetes, diet  controlled with normal antepartum testing, history of HSV with an outbreak  during the pregnancy, but not recently.  Desires repeat cesarean section.  She did not have a tubal ligation.   LABORATORIES:  O+ blood type.  Antibody negative.  Serology nonreactive.  Rubella titer positive.  Hepatitis negative.  HIV nonreactive.  Group B  Strep negative.   HOSPITAL COURSE AND TREATMENT:  The patient presented on May 23, 2003 for  repeat cesarean section.  She delivered a viable female with a birth weight of  8 pounds 15 ounces, Apgar 9/9 without complication.  She did well  postpartum.  She remained afebrile, voiding.  Lochia moderate.  She was  discharged in satisfactory condition on her third postoperative day.   DISCHARGE LABORATORIES:  White count 6.5, hemoglobin 7.9, hematocrit 23.9,  platelets 130,000.   DISPOSITION:  She was discharged to home in satisfactory condition with  asymptomatic anemia.  She was instructed to follow up in six weeks or as  needed.  Continue prenatal vitamins and iron.  Prescription for Tylox was  given with instructions.  She developed a reactive sinus infection on her  discharge day and was given a prescription for Z-Pak and was  instructed to  follow up with no relief.  She did have issues with allergies, sinus  allergy.     [name] [name], N.P.                      [name] [name], M.D.    [name] [name]  D:  06/15/2003  T:  06/15/2003  Job:  [id number]

## 2011-04-03 NOTE — Op Note (Signed)
NAME:  [name] [name], JAFFE                            ACCOUNT NO.:  [id number]   MEDICAL RECORD NO.:  [id number]                   PATIENT TYPE:  INP   LOCATION:  9199                                 FACILITY:  [facility]   PHYSICIAN:  Timothy P. Fontaine, M.D.           DATE OF BIRTH:  [birth year]   DATE OF PROCEDURE:  05/23/2003  DATE OF DISCHARGE:                                 OPERATIVE REPORT   PREOPERATIVE DIAGNOSES:  1. Pregnancy at term.  2. Prior cesarean section.  3. Desires repeat cesarean section.  4. Gestational diabetes, diet controlled.  5. History of herpes simplex virus, type 2.   POSTOPERATIVE DIAGNOSES:  1. Pregnancy at term.  2. Prior cesarean section.  3. Desires repeat cesarean section.  4. Gestational diabetes, diet controlled.  5. History of herpes simplex virus, type 2.   PROCEDURE:  Repeat low transverse cervical cesarean section.   SURGEON:  Timothy P. Fontaine, M.D.   ASSISTANTGaetano Hawthorne. Lily Peer, M.D.   ANESTHESIA:  Spinal.   ESTIMATED BLOOD LOSS:  Less than 500 mL.   COMPLICATIONS:  None.   SPECIMENS:  Samples of cord blood.   FINDINGS:  At 1344 normal female infant, Apgars 9 and 9, weight 8 pounds 15  ounces.  Pelvic anatomy noted to be normal.   PROCEDURE:  The patient was taken to the operating room, underwent spinal  anesthesia, was placed in the left tilt supine position, received an  abdominal preparation with Betadine solution.  The bladder emptied with an  indwelling Foley catheterization placed in a sterile technique.  The patient  was draped in the usual fashion.  After assuring adequate anesthesia, the  abdomen was sharply entered through a repeat Pfannenstiel incision achieving  adequate hemostasis at all levels.  The bladder flap was than sharply and  bluntly developed without difficulty and the uterus was sharply entered in  the lower uterine segment.  The membranes were ruptured.  The fluid noted to  be clear.  The infant's head  delivered through the incision.  The naris and  mouth suctioned.  The rest of the infant delivered.  The cord doubly clamped  and cut and the infant handed to the pediatrics in attendance.  Samples of  cord blood were obtained.  The placenta was then spontaneously extruded and  noted to be intact.  The uterus was exteriorized.  The endometrial cavity  explored with a sponge to remove all placental and membrane fragments.  The  uterine incision was then closed in two layers using 0 Vicryl suture, first  in a running interlocking stitch followed by an imbricating stitch.  The  uterus was then returned to the abdomen which was copiously irrigated.  Adequate hemostasis was visualized.  The anterior fascia was then  reapproximated using 0 Vicryl suture in a running stitch starting at the  angle and meeting in the middle.  The subcutaneous tissues  were irrigated  and the mid incision superior there was some puckering due to scarring from  her prior cesarean section and this scarring was freed in the midline  subcutaneous area using electrocautery.  Again, the incision was irrigated.  Adequate hemostasis achieved with electrocautery and the skin was  reapproximated with staples.  A sterile dressing applied.  The patient taken  to the recovery room in good condition having tolerated the procedure well.                                               Timothy P. Audie Box, M.D.    TPF/MEDQ  D:  05/23/2003  T:  05/23/2003  Job:  [id number]

## 2011-04-03 NOTE — Procedures (Signed)
NAME:  [name] [name], [name] [name]                            ACCOUNT NO.:  [id number]   MEDICAL RECORD NO.:  [id number]                   PATIENT TYPE:  OUT   LOCATION:  RAD                                  FACILITY:  [facility]   PHYSICIAN:  Edward L. Juanetta Gosling, M.D.             DATE OF BIRTH:  Dec 18, 1970   DATE OF PROCEDURE:  DATE OF DISCHARGE:  05/22/2004                              PULMONARY FUNCTION TEST   1. Spirometry is normal.  2. Lung volumes show normal total lung capacity but an elevated residual     volume to some extent suggestive of air trapping.  3. Diffusion capacity of carbon monoxide is mildly reduced.       ___________________________________________                                            Oneal Deputy. Juanetta Gosling, M.D.   ELH/MEDQ  D:  05/26/2004  T:  05/26/2004  Job:  [id number]   cc:   Donna Bernard, M.D.  [address]  [place]  [place] [address]  Fax: [phone]

## 2011-04-03 NOTE — Procedures (Signed)
[name], [name]                  ACCOUNT NO.:  [id number]   MEDICAL RECORD NO.:  [id number]          PATIENT TYPE:  OUT   LOCATION:  RESP                          FACILITY:  [facility]   PHYSICIAN:  Edward L. Juanetta Gosling, M.D.DATE OF BIRTH:  1971/09/24   DATE OF PROCEDURE:  DATE OF DISCHARGE:  05/28/2006                              PULMONARY FUNCTION TEST   1.  Spirometry is normal.  2.  Lung volumes are normal.  3.  DLCO is normal.  4.  There is no bronchodilator improvement.      Edward L. Juanetta Gosling, M.D.  Electronically Signed     ELH/MEDQ  D:  05/31/2006  T:  06/01/2006  Job:  [id number]

## 2011-05-25 ENCOUNTER — Other Ambulatory Visit (HOSPITAL_COMMUNITY): Payer: Self-pay | Admitting: Family Medicine

## 2011-05-25 DIAGNOSIS — M779 Enthesopathy, unspecified: Secondary | ICD-10-CM

## 2011-10-13 ENCOUNTER — Encounter: Payer: Self-pay | Admitting: Internal Medicine

## 2011-10-14 ENCOUNTER — Encounter: Payer: Self-pay | Admitting: Internal Medicine

## 2011-10-14 ENCOUNTER — Ambulatory Visit (INDEPENDENT_AMBULATORY_CARE_PROVIDER_SITE_OTHER): Payer: Managed Care, Other (non HMO) | Admitting: Internal Medicine

## 2011-10-14 VITALS — BP 118/70 | HR 72 | Ht 61.0 in | Wt 123.4 lb

## 2011-10-14 DIAGNOSIS — J309 Allergic rhinitis, unspecified: Secondary | ICD-10-CM

## 2011-10-14 DIAGNOSIS — J45909 Unspecified asthma, uncomplicated: Secondary | ICD-10-CM

## 2011-10-14 DIAGNOSIS — B370 Candidal stomatitis: Secondary | ICD-10-CM

## 2011-10-14 MED ORDER — AEROCHAMBER MV MISC: Status: DC

## 2011-10-14 MED ORDER — FLUCONAZOLE 150 MG PO TABS: 150.0000 mg | ORAL_TABLET | Freq: Once | ORAL | Status: AC

## 2011-10-14 NOTE — Progress Notes (Signed)
10/14/11- 40 yoF never smoker followed for allergic rhinitis, rhinosinusitis.  PCP L[facility] LOV- 06/19/10 She comes alone this visit but has many small children and is considering having another. Has had flu vaccine. Did well through the summer. With onset of early fall weather, began noting more of thick mucus. Frequent colds cycle through her family. She doesn't know when her mild sense of head and chest congestion may be from a viral infection. She now has a nebulizer machine with albuterol. This helps "some, a little". Sticky sensation doesn't go away. Can occasionally cough up a little white phlegm. Mucinex helps. We discussed the importance of hydration as the indoor heat comes on and she admits she probably does not drink enough fluid.  ROS-see HPI Constitutional:   No-   weight loss, night sweats, fevers, chills, fatigue, lassitude. HEENT:   No-  headaches, difficulty swallowing, tooth/dental problems, sore throat,       No-  sneezing, itching, ear ache, +nasal congestion, post nasal drip,  CV:  No-   chest pain, orthopnea, PND, swelling in lower extremities, anasarca,                                  dizziness, palpitations Resp: No-   shortness of breath with exertion or at rest.              No-   productive cough,  No non-productive cough,  No- coughing up of blood.              No-   change in color of mucus.  No- wheezing.   Skin: No-   rash or lesions. GI:  No-   heartburn, indigestion, abdominal pain, nausea, vomiting, diarrhea,                 change in bowel habits, loss of appetite GU: No-   dysuria, change in color of urine, no urgency or frequency.  No- flank pain. MS:  No-   joint pain or swelling.  No- decreased range of motion.  No- back pain. Neuro-     nothing unusual Psych:  No- change in mood or affect. No depression or anxiety.  No memory loss.   OBJ General- Alert, Oriented, Affect-appropriate, Distress- none acute Skin- rash-none, lesions- none, excoriation-  none Lymphadenopathy- none Head- atraumatic            Eyes- Gross vision intact, PERRLA, conjunctivae clear secretions            Ears- Hearing, canals-normal            Nose- Clear, no-Septal dev, mucus, polyps, erosion, perforation             Throat- Mallampati II , mucosa clear , drainage- none, tonsils- atrophic. +Thrush Neck- flexible , trachea midline, no stridor , thyroid nl, carotid no bruit Chest - symmetrical excursion , unlabored           Heart/CV- RRR , no murmur , no gallop  , no rub, nl s1 s2                           - JVD- none , edema- none, stasis changes- none, varices- none           Lung- clear to P&A, wheeze- none, cough- none , dullness-none, rub- none  Chest wall-  Abd- tender-no, distended-no, bowel sounds-present, HSM- no Br/ Gen/ Rectal- Not done, not indicated Extrem- cyanosis- none, clubbing, none, atrophy- none, strength- nl Neuro- grossly intact to observation

## 2011-10-14 NOTE — Patient Instructions (Signed)
Script sent for fluconazole for thrush  Script for aerochamber  To use with your Qvar. For now try changing Qvar to just 2 puffs once daily  Try using less or stopping the antihistamines- Astepro and Zyrtec, when you feel so dry.

## 2011-10-15 DIAGNOSIS — J4530 Mild persistent asthma, uncomplicated: Secondary | ICD-10-CM | POA: Insufficient documentation

## 2011-10-15 DIAGNOSIS — B370 Candidal stomatitis: Secondary | ICD-10-CM | POA: Insufficient documentation

## 2011-10-15 NOTE — Assessment & Plan Note (Signed)
Frequent colds reflecting her 4small children.   Plan-supportive measures including adequate fluid at rest.

## 2011-10-15 NOTE — Assessment & Plan Note (Signed)
She will continue with her Qvar but is rarely needing her rescue inhaler. We discussed recurrent viral infections as an asthma trigger and compared it to allergy.

## 2011-10-15 NOTE — Assessment & Plan Note (Signed)
We've reemphasized the importance of mouth care while using Qvar. She is satisfied that Qvar has helped her. Plan-add AeroChamber. Diflucan

## 2011-10-20 ENCOUNTER — Ambulatory Visit (INDEPENDENT_AMBULATORY_CARE_PROVIDER_SITE_OTHER): Payer: Managed Care, Other (non HMO)

## 2011-10-20 DIAGNOSIS — J309 Allergic rhinitis, unspecified: Secondary | ICD-10-CM

## 2011-12-16 ENCOUNTER — Ambulatory Visit (INDEPENDENT_AMBULATORY_CARE_PROVIDER_SITE_OTHER): Payer: Managed Care, Other (non HMO) | Admitting: Gastroenterology

## 2011-12-16 ENCOUNTER — Encounter: Payer: Self-pay | Admitting: Gastroenterology

## 2011-12-16 DIAGNOSIS — Z83719 Family history of colon polyps, unspecified: Secondary | ICD-10-CM

## 2011-12-16 DIAGNOSIS — K5900 Constipation, unspecified: Secondary | ICD-10-CM

## 2011-12-16 DIAGNOSIS — K219 Gastro-esophageal reflux disease without esophagitis: Secondary | ICD-10-CM

## 2011-12-16 DIAGNOSIS — R1310 Dysphagia, unspecified: Secondary | ICD-10-CM | POA: Insufficient documentation

## 2011-12-16 DIAGNOSIS — D649 Anemia, unspecified: Secondary | ICD-10-CM | POA: Insufficient documentation

## 2011-12-16 DIAGNOSIS — Z8371 Family history of colonic polyps: Secondary | ICD-10-CM

## 2011-12-16 MED ORDER — OMEPRAZOLE 20 MG PO CPDR: DELAYED_RELEASE_CAPSULE | ORAL | Status: DC

## 2011-12-16 NOTE — Assessment & Plan Note (Signed)
Sx not ideally controlled.  TAKE PRILOSEC 30 MINUTES PRIOR TO MEALS TWICE DAILY. USE TAGAMET AS NEEDED. FOLLOW UP IN 4 MOS.

## 2011-12-16 NOTE — Progress Notes (Signed)
Faxed to PCP

## 2011-12-16 NOTE — Assessment & Plan Note (Addendum)
No overt s/sX of GIB. MOTHER HAD SEVERAL POLYPS AT AGE < 60.  COMPLETE IUPPER/LOWER ENDOSCOPY. WILL BIOPSY DUODENUM. USE PROBIOTIC DAILY (ALIGN OR [employer]). FOLLOW UP IN 4 MOS.

## 2011-12-16 NOTE — Assessment & Plan Note (Signed)
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  COMPLETE UPPER ENDOSCOPY/dil. FOLLOW UP IN 4 MOS.

## 2011-12-16 NOTE — Progress Notes (Signed)
Reminder in epic to follow up in 4 months °

## 2011-12-16 NOTE — Progress Notes (Signed)
Subjective:    Patient ID: [name] [name], female    DOB: 05/06/1971, 41 y.o.   MRN: [id number]   PCP: [facility]  HPI  HAS SYMPTOMS WRITTEN DOWN IN A NOTEBOOK so she can remember all Sx to discuss.  Had mono. IBS for 5-6 years-diarrhea, constipation, nausea, & BLOATING. HAS GERD 6-7 YEARS AGO. TAKING TAGAMET & BEFORE THAT STOP TAKING NEXIUM because seemed to stop working. TAGAMET: WORSE WHEN SHE STOPS FOR A FEW DAYS. pROBLESM SWALLOWING OFF AND ON FOR A COUPLE OF YEARS-SOLIDS ONLY. RARE NAUSEA. No vomiting. Unintentional weight loss: 10 lbs. BmsL: every day. Taking iron for anemia (blood count?) for 3 weeks. CYCLE: Q MO, LASTS JAN 15-3 HEAVY, 2 LIGHT. Off and on problems with anemia in the past 1-2 years. With 3rd child had a transfusion. She's a grazer. Eats a palm size serving meat/day.  Hemorrhoids: itching/burning/protruding. Past year: 2 episode instant pain/pressure. WONDERING IF SHE NEEDED A YEAST PURGE.  Past Medical History  Diagnosis Date  . Chronic rhinosinusitis   . Allergic rhinitis   . Cough   . Cardiac murmur   . HSV infection     History of-outbreak during pregnancy 2004    Past Surgical History  Procedure Date  . Cesarean section 2002,2004,2008  . Nasal sinus surgery 2005    Allergies  Allergen Reactions  . Sulfonamide Derivatives Anaphylaxis    Current Outpatient Prescriptions  Medication Sig Dispense Refill  . albuterol (PROVENTIL HFA) 108 (90 BASE) MCG/ACT inhaler Inhale 2 puffs into the lungs 4 (four) times daily as needed.        . Azelastine HCl (ASTEPRO) 0.15 % SOLN Place 2 puffs into the nose at bedtime.        . beclomethasone (QVAR) 80 MCG/ACT inhaler Inhale 2 puffs into the lungs 2 (two) times daily.        . cetirizine (ZYRTEC) 10 MG tablet Take 10 mg by mouth daily.        . cimetidine (TAGAMET) 200 MG tablet Take 400 mg by mouth 2 (two) times daily.        . EPINEPHrine (EPIPEN) 0.3 mg/0.3 mL DEVI Inject 0.3 mg into the muscle once.        .  guaiFENesin (MUCINEX) 600 MG 12 hr tablet Take 1,200 mg by mouth 2 (two) times daily.        . mometasone (NASONEX) 50 MCG/ACT nasal spray Place 2 sprays into the nose daily.        . montelukast (SINGULAIR) 10 MG tablet Take 10 mg by mouth at bedtime.        . Multiple Vitamin (MULTIVITAMIN) tablet Take 1 tablet by mouth daily.        . Spacer/Aero-Holding Chambers (AEROCHAMBER MV) inhaler Use as instructed  1 each  0  .        Family History  Problem Relation Age of Onset  . Allergies Sister   . Allergies Brother   . Cervical cancer Mother   . Colon polyps Mother 51    several "benign" polyps  . Breast cancer      grandmother    History   Social History  . Marital Status: Married    Spouse Name: N/A    Number of Children: 4  . Years of Education: N/A   Occupational History  . 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 Smoking status: Never Smoker   .    . Alcohol Use:   No  . Drug Use: No  .         Review of Systems  All other systems reviewed and are negative.       Objective:   Physical Exam  Vitals reviewed. Constitutional: She is oriented to person, place, and time. She appears well-developed and well-nourished. No distress.  HENT:  Head: Normocephalic and atraumatic.  Mouth/Throat: Oropharynx is clear and moist. No oropharyngeal exudate.  Eyes: Pupils are equal, round, and reactive to light. No scleral icterus.  Neck: Normal range of motion. Neck supple.  Cardiovascular: Normal rate, regular rhythm and normal heart sounds.   Pulmonary/Chest: Breath sounds normal. No respiratory distress.  Abdominal: Soft. Bowel sounds are normal. She exhibits no distension. There is no tenderness.  Musculoskeletal: Normal range of motion. She exhibits no edema.  Lymphadenopathy:    She has no cervical adenopathy.  Neurological: She is alert and oriented to person, place, and time.       NO FOCAL DEFICITS     Psychiatric:       ANXIOUS MOOD LABILE AFFEECT          Assessment & Plan:   

## 2011-12-16 NOTE — Patient Instructions (Signed)
TAKE PRILOSEC 30 MINUTES PRIOR TO MEALS TWICE DAILY. COMPLETE UPPER AND LOWER ENDOSCOPY. USE TAGAMET AS NEEDED. USE PROBIOTIC DAILY (ALIGN OR [employer]). FOLLOW UP IN 4 MOS.   Lifestyle and home remedies You may eliminate or reduce the frequency of heartburn by making the following lifestyle changes:   Control your weight. Being overweight is a major risk factor for heartburn and GERD. Excess pounds put pressure on your abdomen, pushing up your stomach and causing acid to back up into your esophagus.    Eat smaller meals. 4 TO 6 MEALS A DAY. This reduces pressure on the lower esophageal sphincter, helping to prevent the valve from opening and acid from washing back into your esophagus.    Loosen your belt. Clothes that fit tightly around your waist put pressure on your abdomen and the lower esophageal sphincter.    Eliminate heartburn triggers. Everyone has specific triggers. Common triggers such as fatty or fried foods, spicy food, tomato sauce, carbonated beverages, alcohol, chocolate, mint, garlic, onion, caffeine and nicotine may make heartburn worse.    Avoid stooping or bending. Tying your shoes is OK. Bending over for longer periods to weed your garden isn't, especially soon after eating.    Don't lie down after a meal. Wait at least three to four hours after eating before going to bed, and don't lie down right after eating.   Alternative medicine   Several home remedies exist for treating GERD, but they provide only temporary relief. They include drinking baking soda (sodium bicarbonate) added to water or drinking other fluids such as baking soda mixed with cream of tartar and water.   Although these liquids create temporary relief by neutralizing, washing away or buffering acids, eventually they aggravate the situation by adding gas and fluid to your stomach, increasing pressure and causing more acid reflux. Further, adding more sodium to your diet may increase your blood pressure and add  stress to your heart, and excessive bicarbonate ingestion can alter the acid-base balance in your body.

## 2011-12-17 ENCOUNTER — Other Ambulatory Visit: Payer: Self-pay | Admitting: Gastroenterology

## 2011-12-17 ENCOUNTER — Telehealth: Payer: Self-pay | Admitting: Gastroenterology

## 2011-12-17 ENCOUNTER — Encounter: Payer: Self-pay | Admitting: Gastroenterology

## 2011-12-17 MED ORDER — PEG 3350-KCL-NA BICARB-NACL 420 G PO SOLR: ORAL | Status: AC

## 2011-12-17 NOTE — Telephone Encounter (Signed)
Received fax from [facility] stating pt requesting prep be changed to [facility] for expense reasons - new order and instructions faxed to [facility]

## 2011-12-18 ENCOUNTER — Encounter (HOSPITAL_COMMUNITY): Payer: Self-pay | Admitting: Pharmacy Technician

## 2011-12-18 HISTORY — PX: ESOPHAGOGASTRODUODENOSCOPY: SHX1529

## 2011-12-23 ENCOUNTER — Other Ambulatory Visit (HOSPITAL_COMMUNITY): Payer: Self-pay | Admitting: Pediatrics

## 2011-12-23 ENCOUNTER — Ambulatory Visit (HOSPITAL_COMMUNITY)
Admission: RE | Admit: 2011-12-23 | Discharge: 2011-12-23 | Disposition: A | Discharge status: Home / Self Care | Payer: Managed Care, Other (non HMO) | Source: Ambulatory Visit | Attending: Pediatrics | Admitting: Pediatrics

## 2011-12-23 DIAGNOSIS — R059 Cough, unspecified: Secondary | ICD-10-CM | POA: Insufficient documentation

## 2011-12-23 DIAGNOSIS — R0602 Shortness of breath: Secondary | ICD-10-CM

## 2011-12-23 DIAGNOSIS — R079 Chest pain, unspecified: Secondary | ICD-10-CM | POA: Insufficient documentation

## 2011-12-23 DIAGNOSIS — R05 Cough: Secondary | ICD-10-CM | POA: Insufficient documentation

## 2011-12-24 MED ORDER — SODIUM CHLORIDE 0.45 % IV SOLN
Freq: Once | INTRAVENOUS | Status: AC
  Administered 2011-12-25: 07:00:00 via INTRAVENOUS

## 2011-12-25 ENCOUNTER — Ambulatory Visit (HOSPITAL_COMMUNITY)
Admission: RE | Admit: 2011-12-25 | Discharge: 2011-12-25 | Disposition: A | Discharge status: Home / Self Care | Payer: Managed Care, Other (non HMO) | Source: Ambulatory Visit | Attending: Gastroenterology | Admitting: Gastroenterology

## 2011-12-25 ENCOUNTER — Encounter (HOSPITAL_COMMUNITY): Payer: Self-pay | Admitting: *Deleted

## 2011-12-25 ENCOUNTER — Encounter (HOSPITAL_COMMUNITY)
Admission: RE | Disposition: A | Discharge status: Home / Self Care | Payer: Self-pay | Source: Ambulatory Visit | Attending: Gastroenterology

## 2011-12-25 ENCOUNTER — Other Ambulatory Visit: Payer: Self-pay | Admitting: Gastroenterology

## 2011-12-25 DIAGNOSIS — K648 Other hemorrhoids: Secondary | ICD-10-CM | POA: Insufficient documentation

## 2011-12-25 DIAGNOSIS — Z83719 Family history of colon polyps, unspecified: Secondary | ICD-10-CM | POA: Insufficient documentation

## 2011-12-25 DIAGNOSIS — K2940 Chronic atrophic gastritis without bleeding: Secondary | ICD-10-CM | POA: Insufficient documentation

## 2011-12-25 DIAGNOSIS — D126 Benign neoplasm of colon, unspecified: Secondary | ICD-10-CM

## 2011-12-25 DIAGNOSIS — D649 Anemia, unspecified: Secondary | ICD-10-CM

## 2011-12-25 DIAGNOSIS — Z8371 Family history of colonic polyps: Secondary | ICD-10-CM | POA: Insufficient documentation

## 2011-12-25 DIAGNOSIS — D509 Iron deficiency anemia, unspecified: Secondary | ICD-10-CM | POA: Insufficient documentation

## 2011-12-25 HISTORY — PX: COLONOSCOPY: SHX174

## 2011-12-25 SURGERY — COLONOSCOPY WITH ESOPHAGOGASTRODUODENOSCOPY (EGD)
Anesthesia: Moderate Sedation

## 2011-12-25 MED ORDER — MIDAZOLAM HCL 5 MG/5ML IJ SOLN
INTRAMUSCULAR | Status: DC | PRN
  Administered 2011-12-25 (×2): 1 mg via INTRAVENOUS
  Administered 2011-12-25 (×2): 2 mg via INTRAVENOUS

## 2011-12-25 MED ORDER — PROMETHAZINE HCL 25 MG/ML IJ SOLN
INTRAMUSCULAR | Status: AC
  Filled 2011-12-25: qty 1

## 2011-12-25 MED ORDER — MIDAZOLAM HCL 5 MG/5ML IJ SOLN
INTRAMUSCULAR | Status: AC
  Filled 2011-12-25: qty 10

## 2011-12-25 MED ORDER — BUTAMBEN-TETRACAINE-BENZOCAINE 2-2-14 % EX AERO
INHALATION_SPRAY | CUTANEOUS | Status: DC | PRN
  Administered 2011-12-25: 2 via TOPICAL

## 2011-12-25 MED ORDER — MEPERIDINE HCL 100 MG/ML IJ SOLN
INTRAMUSCULAR | Status: DC | PRN
  Administered 2011-12-25 (×2): 50 mg via INTRAVENOUS

## 2011-12-25 MED ORDER — MINERAL OIL PO OIL
TOPICAL_OIL | ORAL | Status: AC
  Filled 2011-12-25: qty 30

## 2011-12-25 MED ORDER — STERILE WATER FOR IRRIGATION IR SOLN
Status: DC | PRN
  Administered 2011-12-25: 08:00:00

## 2011-12-25 MED ORDER — MEPERIDINE HCL 100 MG/ML IJ SOLN
INTRAMUSCULAR | Status: AC
  Filled 2011-12-25: qty 2

## 2011-12-25 MED ORDER — SODIUM CHLORIDE 0.9 % IJ SOLN
INTRAMUSCULAR | Status: AC
  Filled 2011-12-25: qty 10

## 2011-12-25 NOTE — Op Note (Signed)
[facility] [address] [place], [place]  [address]  COLONOSCOPY PROCEDURE REPORT  PATIENT:  [name], [name]  MR#:  [id number] BIRTHDATE:  1971/09/14, 40 yrs. old  GENDER:  female  ENDOSCOPIST:  Jonette Eva, MD REF. BY:  Artis Delay, M.D. ASSISTANT:  PROCEDURE DATE:  12/25/2011 PROCEDURE:  Colonoscopy with biopsy  INDICATIONS:  anemia, mother had several polyps  MEDICATIONS:   Demerol 100 mg IV, Versed 5 mg IV  DESCRIPTION OF PROCEDURE:    Physical exam was performed. Informed consent was obtained from the patient after explaining the benefits, risks, and alternatives to procedure.  The patient was connected to monitor and placed in left lateral position. Continuous oxygen was provided by nasal cannula and IV medicine administered through an indwelling cannula.  After administration of sedation and rectal exam, the patient's rectum was intubated and the EC-[id number] ([id number]) colonoscope was advanced under direct visualization to the cecum.  The scope was removed slowly by carefully examining the color, texture, anatomy, and integrity mucosa on the way out.  The patient was recovered in endoscopy and discharged home in satisfactory condition. <<PROCEDUREIMAGES>>  FINDINGS:  A 4 MM sessile polypOID was found in the ascending colon & REMOVED VIA COLD FORCEPS. NL ILEUM. SMALL INTERNAL HEMORRHOIDS.  PREP QUALITY: EXCELLENT CECAL W/D TIME:    10 minutes  COMPLICATIONS:    None  ENDOSCOPIC IMPRESSION: 1) Sessile polyp in the ascending colon 2) SMALL INTERNAL HEMORRHOIS 3) SLIGHTLY TORTUOUS LEFT COLON  RECOMMENDATIONS: TCS IN 5 YEARS Await biopsies HIGH FIBER DIET PROCEED TO EGD NO SOURCE FOR ANEMIA IDENTIFIED  REPEAT EXAM:  No  ______________________________ Jonette Eva, MD  CC:  Artis Delay, M.D.  n. eSIGNED:   Landrie Beale at 12/25/2011 08:30 AM  Terese Door, [phone]

## 2011-12-25 NOTE — Interval H&P Note (Signed)
History and Physical Interval Note:  12/25/2011 7:48 AM  [name] [name]  has presented today for surgery, with the diagnosis of dysphagia,constipation  The various methods of treatment have been discussed with the patient and family. After consideration of risks, benefits and other options for treatment, the patient has consented to  Procedure(s): COLONOSCOPY WITH ESOPHAGOGASTRODUODENOSCOPY (EGD) as a surgical intervention .  The patients' history has been reviewed, patient examined, no change in status, stable for surgery.  I have reviewed the patients' chart and labs.  Questions were answered to the patient's satisfaction.     [employer]

## 2011-12-25 NOTE — H&P (View-Only) (Signed)
Subjective:    Patient ID: [name] [name], female    DOB: 06-30-1971, 41 y.o.   MRN: [id number]   PCP: HURST  HPI  HAS SYMPTOMS WRITTEN DOWN IN A NOTEBOOK so she can remember all Sx to discuss.  Had mono. IBS for 5-6 years-diarrhea, constipation, nausea, & BLOATING. HAS GERD 6-7 YEARS AGO. TAKING TAGAMET & BEFORE THAT STOP TAKING NEXIUM because seemed to stop working. TAGAMET: WORSE WHEN SHE STOPS FOR A FEW DAYS. pROBLESM SWALLOWING OFF AND ON FOR A COUPLE OF YEARS-SOLIDS ONLY. RARE NAUSEA. No vomiting. Unintentional weight loss: 10 lbs. BmsL: every day. Taking iron for anemia (blood count?) for 3 weeks. CYCLE: Q MO, LASTS JAN 15-3 HEAVY, 2 LIGHT. Off and on problems with anemia in the past 1-2 years. With 3rd child had a transfusion. She's a grazer. Eats a palm size serving meat/day.  Hemorrhoids: itching/burning/protruding. Past year: 2 episode instant pain/pressure. WONDERING IF SHE NEEDED A YEAST PURGE.  Past Medical History  Diagnosis Date  . Chronic rhinosinusitis   . Allergic rhinitis   . Cough   . Cardiac murmur   . HSV infection     History of-outbreak during pregnancy 2004    Past Surgical History  Procedure Date  . Cesarean section 2002,2004,2008  . Nasal sinus surgery 2005    Allergies  Allergen Reactions  . Sulfonamide Derivatives Anaphylaxis    Current Outpatient Prescriptions  Medication Sig Dispense Refill  . albuterol (PROVENTIL HFA) 108 (90 BASE) MCG/ACT inhaler Inhale 2 puffs into the lungs 4 (four) times daily as needed.        . Azelastine HCl (ASTEPRO) 0.15 % SOLN Place 2 puffs into the nose at bedtime.        . beclomethasone (QVAR) 80 MCG/ACT inhaler Inhale 2 puffs into the lungs 2 (two) times daily.        . cetirizine (ZYRTEC) 10 MG tablet Take 10 mg by mouth daily.        . cimetidine (TAGAMET) 200 MG tablet Take 400 mg by mouth 2 (two) times daily.        Marland Kitchen EPINEPHrine (EPIPEN) 0.3 mg/0.3 mL DEVI Inject 0.3 mg into the muscle once.        Marland Kitchen  guaiFENesin (MUCINEX) 600 MG 12 hr tablet Take 1,200 mg by mouth 2 (two) times daily.        . mometasone (NASONEX) 50 MCG/ACT nasal spray Place 2 sprays into the nose daily.        . montelukast (SINGULAIR) 10 MG tablet Take 10 mg by mouth at bedtime.        . Multiple Vitamin (MULTIVITAMIN) tablet Take 1 tablet by mouth daily.        Marland Kitchen Spacer/Aero-Holding Chambers (AEROCHAMBER MV) inhaler Use as instructed  1 each  0  .        Family History  Problem Relation Age of Onset  . Allergies Sister   . Allergies Brother   . Cervical cancer Mother   . Colon polyps Mother 77    several "benign" polyps  . Breast cancer      grandmother    History   Social History  . Marital Status: Married    Spouse Name: N/A    Number of Children: 4  . Years of Education: N/A   Occupational History  . 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 Smoking status: Never Smoker   .    Marland Kitchen Alcohol Use:  No  . Drug Use: No  .         Review of Systems  All other systems reviewed and are negative.       Objective:   Physical Exam  Vitals reviewed. Constitutional: She is oriented to person, place, and time. She appears well-developed and well-nourished. No distress.  HENT:  Head: Normocephalic and atraumatic.  Mouth/Throat: Oropharynx is clear and moist. No oropharyngeal exudate.  Eyes: Pupils are equal, round, and reactive to light. No scleral icterus.  Neck: Normal range of motion. Neck supple.  Cardiovascular: Normal rate, regular rhythm and normal heart sounds.   Pulmonary/Chest: Breath sounds normal. No respiratory distress.  Abdominal: Soft. Bowel sounds are normal. She exhibits no distension. There is no tenderness.  Musculoskeletal: Normal range of motion. She exhibits no edema.  Lymphadenopathy:    She has no cervical adenopathy.  Neurological: She is alert and oriented to person, place, and time.       NO FOCAL DEFICITS     Psychiatric:       ANXIOUS MOOD LABILE AFFEECT          Assessment & Plan:

## 2011-12-25 NOTE — Op Note (Signed)
[facility] [address] [place], [place]  [address]  ENDOSCOPY PROCEDURE REPORT  PATIENT:  [name] [name], [name] [name]  MR#:  [id number] BIRTHDATE:  Sep 21, 1971, 40 yrs. old  GENDER:  female  ENDOSCOPIST:  Jonette Eva, MD Referred by:  Artis Delay, M.D.  PROCEDURE DATE:  12/25/2011 PROCEDURE:  EGD with biopsy, 432[address] ASA CLASS: INDICATIONS:  ANEMIA  MEDICATIONS:   TCS+ Versed 1 mg IV TOPICAL ANESTHETIC:  Cetacaine Spray  DESCRIPTION OF PROCEDURE:     Physical exam was performed. Informed consent was obtained from the patient after explaining the benefits, risks, and alternatives to the procedure.  The patient was connected to the monitor and placed in the left lateral position.  Continuous oxygen was provided by nasal cannula and IV medicine administered through an indwelling cannula.  After administration of sedation, the patient's esophagus was intubated and the EC-[id number] ([id number]) and EG-[id number] ([id number]) endoscope was advanced under direct visualization to the second portion of the duodenum.  The scope was removed slowly by carefully examining the color, texture, anatomy, and integrity of the mucosa on the way out.  The patient was recovered in endoscopy and discharged home in satisfactory condition. <<PROCEDUREIMAGES>>  Normal stomach without ulcer, mass, AVM, inflammation or other pathology.  Normal duodenum without inflammation, mass, ulcer, AVM, or other pathology. NL ESOPHAGUS  COMPLICATIONS:    None  ENDOSCOPIC IMPRESSION: 1) Nl stomach WMO 2) Nl duodenum WMO 3) nl esophagus RECOMMENDATIONS: OMP BID AWAIT BIOPSIES OPV JUN 2013  REPEAT EXAM:  No  ______________________________ Jonette Eva, MD  CC:  n. eSIGNED:   Davinder Haff at 12/25/2011 09:28 AM  Terese Door, [phone]

## 2011-12-31 ENCOUNTER — Telehealth: Payer: Self-pay | Admitting: Gastroenterology

## 2011-12-31 NOTE — Telephone Encounter (Signed)
Please call pt. His stomach Bx shows mild gastritis. Continue OMP 30 minutes prior to meals TWICE DAILY. SHe had AN adenomas removed from HER colon. TCS in 5 years. FOLLOW A High fiber diet. OPV IN JUN 2013 & WE WILL RECHECK HER BLOOD COUNT(CBC).

## 2011-12-31 NOTE — Telephone Encounter (Signed)
Faxed results to PCP an 5 yr reminder in the computer

## 2011-12-31 NOTE — Telephone Encounter (Signed)
Called and informed pt of the results

## 2012-01-04 NOTE — Telephone Encounter (Signed)
Reminder in epic to have tcs in 5 years °

## 2012-01-06 ENCOUNTER — Encounter (HOSPITAL_COMMUNITY): Payer: Self-pay | Admitting: Oncology

## 2012-01-06 ENCOUNTER — Encounter (HOSPITAL_COMMUNITY): Payer: Managed Care, Other (non HMO) | Attending: Oncology | Admitting: Oncology

## 2012-01-06 DIAGNOSIS — Z20828 Contact with and (suspected) exposure to other viral communicable diseases: Secondary | ICD-10-CM | POA: Insufficient documentation

## 2012-01-06 DIAGNOSIS — D649 Anemia, unspecified: Secondary | ICD-10-CM

## 2012-01-06 DIAGNOSIS — B009 Herpesviral infection, unspecified: Secondary | ICD-10-CM | POA: Insufficient documentation

## 2012-01-06 DIAGNOSIS — K589 Irritable bowel syndrome without diarrhea: Secondary | ICD-10-CM | POA: Insufficient documentation

## 2012-01-06 DIAGNOSIS — F411 Generalized anxiety disorder: Secondary | ICD-10-CM | POA: Insufficient documentation

## 2012-01-06 DIAGNOSIS — N920 Excessive and frequent menstruation with regular cycle: Secondary | ICD-10-CM | POA: Insufficient documentation

## 2012-01-06 DIAGNOSIS — J329 Chronic sinusitis, unspecified: Secondary | ICD-10-CM

## 2012-01-06 DIAGNOSIS — K219 Gastro-esophageal reflux disease without esophagitis: Secondary | ICD-10-CM | POA: Insufficient documentation

## 2012-01-06 DIAGNOSIS — G4700 Insomnia, unspecified: Secondary | ICD-10-CM | POA: Insufficient documentation

## 2012-01-06 DIAGNOSIS — D509 Iron deficiency anemia, unspecified: Secondary | ICD-10-CM | POA: Insufficient documentation

## 2012-01-06 DIAGNOSIS — J019 Acute sinusitis, unspecified: Secondary | ICD-10-CM

## 2012-01-06 HISTORY — DX: Contact with and (suspected) exposure to other viral communicable diseases: Z20.828

## 2012-01-06 NOTE — Progress Notes (Signed)
[facility] NEW PATIENT EVALUATION   Name: [name] [name] Date: 01/06/2012 MRN: [id number] DOB: May 22, 1971    CC: [name] [name], DA     DIAGNOSIS: Anemia   HISTORY OF PRESENT ILLNESS:[name] [name] is a 41 y.o. female who has a past medical history significant for exposure to mononucleosis with a positive monospot, allergic rhinitis, chronic sinusitis, history of 4-5 antibiotic courses in the last 4 months, insomnia, anxiety, GERD, IBS, and HSV who was referred to the [facility] for further evaluation of her anemia.  The patient reports that in December 2012 she was seen by her primary care physician who placed her on oral iron tablets, presumably ferrous sulfate 325 mg 3 times daily, for anemia. She took these tablets as ordered up until 12/18/2011 approximately one week before her scheduled colonoscopy. Since that time of stopping the iron pills, she has not been taking them. On 11/30/2011 the patient had laboratory work performed which reveals a white blood cell count of 6.1, hemoglobin 11.7, MCV 89.4, and platelet count of 383,000. She had a vitamin B12 level of 1763 and a folate level of greater than 20. He was noted that her ferritin was 26 at that time. Sedimentation rate 14. He was during this laboratory work that her mono screen was positive. During this time frame, the patient was on oral iron as noted above. The patient discontinued her oral iron on 12/18/2011 in preparation for upper and lower endoscopy. This was performed by Dr. Jonette Eva on 12/25/2011. A colon, duodenal, and stomach biopsy was performed in all of which were negative.  Since this study, the patient is not taking her oral iron preparation as noted above.  The patient has an interesting history in that like to take a moment to recap what has been going on the past 6 months of the patient's life. As noted above, the patient has a chronic history of sinusitis which is followed by Dr. Andrey Campanile who is  an ENT physician. Patient notes the she's had a feeling of malaise and production of whitish yellow nasal discharge. Since November 2012, she's been treated with approximately 4 antibiotics for this discharge. None of which have made the patient feel better. Subsequently, the patient experienced thrush and vaginal yeast infection. This was treated with Diflucan and has resolved as of today. She experiences some face pain and numbness bilaterally. No frontal or maxillary sinus tenderness on palpation.  The patient reports that since moving to [place] from her home town in [place] she has been diagnosed with allergies and is presently undergoing desensitizing injections.  The patient reports that she continues to menstruate. She is finishing her last menstrual cycle today which began on 01/01/2012. She notes that she has some menorrhagia and she is not on birth control. She reports that her menstrual cycles are usually every 28-30 days. Her periods last approximately 4-5 days. She admits that her menstrual cycles have become heavier in the past year or 2.  The patient admits to fatigue, occasional lightheadedness, tiredness, and malaise. She denies any fevers, chills, night sweats, blood in stool, black tarry stool, hematuria, gingival bleeding, easy bruisability, spontaneous bleeding, chest pain, heart palpitations, abdominal pain, shortness of breath, nausea, vomiting, constipation, and diarrhea.  The patient reports that in the past she's had a negative HIV test. Patient also admits to some weight loss. She reports that her for one year ago she weighed 125 pounds. She weighs in today at 118 pounds.  FAMILY HISTORY: family history includes Allergies in her brother and sister; Breast cancer in an unspecified family member; Cancer in her maternal grandmother and mother; Cervical cancer in her mother; and Colon polyps (age of onset:51) in her mother.  There is no history of Colon cancer.  The  patient's father is still alive at the age of 71 and he is well. Patient's mother is alive at the age of 60 and she has a history of lung cancer. She is a lung cancer survivor. Patient reports that she underwent resection followed by radiation. Patient has a brother and a sister who are ages 54 and 37 respectively. Her sister has allergies. Her brother has no medical problems that she is aware of. Patient has 4 children. She has 3 sons ages 11, 34, and 15. She's 1 daughter who is 68 years old. They are all healthy.  PAST MEDICAL HISTORY:  has a past medical history of Chronic rhinosinusitis; Allergic rhinitis; Cough; Cardiac murmur; HSV infection; Mono exposure; Thrush, oral; Yeast infection of the vagina; and Mononucleosis (01/06/2012).     CURRENT MEDICATIONS:  1. Albuterol 108 mcg 2 puffs 4 times daily as needed 2. Albuterol 2.5 mg/3 mL nebulized every 6 hours as needed 3. Asked the pros 0.15% solution 2 puffs nasally daily at bedtime 4. Qvar 80 mcg 2 puffs 2 times daily 5. Zyrtec 10 mg daily 6. Benadryl 25 mg at bedtime as needed 7. Mucinex 1200 mg twice daily 8. Nasonex 50 mcg 2 sprays nasally daily 9. Singulair 10 mg daily at bedtime 10. Multivitamin one tablet daily 11. Omeprazole 20 mg daily 12. Metamucil 1 packet 3 times daily   SOCIAL HISTORY:  reports that she has never smoked. She has never used smokeless tobacco. She reports that she does not drink alcohol or use illicit drugs.  The patient was born and raised in [place], [place]. She moved to [place], [place] when she was laid off from her job in [place]. Patient holds a bachelor's degree in accounting which was ascertained from [employer].  She also holds a number of engineering certificates. 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 She been married for 13 years and lives with her husband and children at home. Her husband is a Emergency planning/management officer for the  city of [employer].  She is in a monogamous relationship with her husband.  They have been attempting to have another child in the past two years without any success.  She denies any alcohol, tobacco or illicit drug abuse. 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 She now continues to lift weights but is not competing.    ALLERGIES: Sulfonamide derivatives   LABORATORY DATA:  12/23/2011: White blood cell count 8.1, hemoglobin 11.7, MCV 88.3, platelet count 378, serum iron 45, TIBC 396, percent saturation 11%, vitamin B12 994, folate 16.5, ferritin 14  11/30/2011: White blood cell count 6.1, hemoglobin 11.7, MCV 89.4, platelet count of 383, serum iron 80, TIBC 378, percent saturation 21%, vitamin B12 1763, folate greater than 20, ferritin 26, sedimentation rate 14, within normal limits complete metabolic panel, TSH 2.708, mono screen positive.    REVIEW OF SYSTEMS: Patient reports a history of  anemia, heart murmur, weight loss and chronic sinusitis.   PHYSICAL EXAM:  height is 5\' 1"  (1.549 m) and weight is 118 lb 14.4 oz (53.933 kg). Her oral temperature is 98.2 F (36.8  C). Her blood pressure is 128/66 and her pulse is 93.  General appearance: alert, cooperative, appears stated age, no distress and broad shouldered, well-kempt Head: Normocephalic, without obvious abnormality, atraumatic, sinuses nontender to percussion Neck: no adenopathy, no carotid bruit, no JVD, supple, symmetrical, trachea midline and thyroid not enlarged, symmetric, no tenderness/mass/nodules Lymph nodes: Cervical, supraclavicular, and axillary nodes normal. Resp: clear to auscultation bilaterally and normal percussion bilaterally Back: symmetric, no curvature. ROM normal. No CVA tenderness. Cardio: regular rate and rhythm, S1, S2 normal, no murmur, click, rub or gallop GI: soft, non-tender; bowel sounds normal; no masses,  no organomegaly Extremities: extremities normal, atraumatic, no  cyanosis or edema Neurologic: Alert and oriented X 3, normal strength and tone. Normal symmetric reflexes. Normal coordination and gait  PATHOLOGY:  12/25/2011 Diagnosis 1. Colon, biopsy, polypoid lesion ascending colon - SESSILE SERRATED ADENOMA (X1). 2. Duodenum, NOS biopsy - HISTOLOGICALLY NORMAL SMALL BOWEL MUCOSA. - NEGATIVE FOR MORPHOLOGICAL FEATURES OF GLUTEN SENSITIVE ENTEROPATHY (SPRUE). - NO GIARDIA IDENTIFIED. 3. Stomach, biopsy - MILD CHRONIC INACTIVE GASTRITIS (OXYNTIC MUCOSA). - WARTHIN STARRY STAIN NEGATIVE FOR H. PYLORI. [place] RUND DO Pathologist, Electronic Signature (Case signed 12/28/2011)   IMPRESSION:  1. Anemia, element of iron deficiency anemia 2. Chronic Sinusitis 3. Multiple Antibiotic regimens in the past 6 months without improvement in symptoms. 4. Allergic rhinitis 5. Insomnia 6. Anxiety 7. Exposure to mononucleosis 8. History of oral thrush following antibiotic usage 9. History of vaginal yeast infection following antibiotic usage. 10. GERD 11. IBS 12. HSV 13. Menorrhagia, not on birth control   PLAN:  1. Ferrous sulfate 325 mg BID 2. Lab work today: Serum IgG, Serum IgM, Serum IgA, Epstein Barr virus IgM and IgG, CMV IgM and IgG. 3. Lab work in 6 weeks: CBC diff, Ferritin, Iron/TIBC 4. Patient education regarding iron deficiency anemia.  We discussed the pathophysiology and etiology of iron deficiency anemia.  We discussed foods high in iron and utilizing an iron skillet for cooking food products. 5. We will try oral iron for 6 weeks.  We may consider IV iron in the future if the situation requires.  6. Her picture is not 100% clear in light of her other complaints and not feeling well since November despite multiple antibiotic regimens in a short amount of time.  We will check to see when her exposure to mononucleosis was with the Epstein Barr IgG and IgM.  We will also check her immunoglobulin status in light of her lack of recovery following  antibiotics.  Will also evaluate for CMV exposure. She reports that she had an HIV test in the past and it was negative and she is in a monogamous relationship. 7. Patient will return for follow-up in 6 weeks.   All questions were answered.  The patient knows to call the clinic with any questions or concerns.    Patient and plan discussed with Dr. Glenford Peers and he is in agreement with the aforementioned.  Patient seen and examined by Dr. Glenford Peers.  Jolynne Spurgin

## 2012-01-06 NOTE — Patient Instructions (Signed)
[name] [name]  [id number] [birth year]   [facility]  Discharge Instructions  RECOMMENDATIONS MADE BY THE CONSULTANT AND ANY TEST RESULTS WILL BE SENT TO YOUR REFERRING DOCTOR.   EXAM FINDINGS BY MD TODAY AND SIGNS AND SYMPTOMS TO REPORT TO CLINIC OR PRIMARY MD: need to do some labs to see if we can determine what is going on.  MEDICATIONS PRESCRIBED: Take your iron twice daily for 6 weeks. Follow label directions    SPECIAL INSTRUCTIONS/FOLLOW-UP: Lab work Needed in 6 weeks  and Return to Clinic in 6 weeks a few days after labs are done.   I acknowledge that I have been informed and understand all the instructions given to me and received a copy. I do not have any more questions at this time, but understand that I may call the Specialty Clinic at [facility] at [phone] during business hours should I have any further questions or need assistance in obtaining follow-up care.    __________________________________________  _____________  __________ Signature of Patient or Authorized Representative            Date                   Time    __________________________________________ Nurse's Signature

## 2012-01-07 ENCOUNTER — Other Ambulatory Visit (HOSPITAL_COMMUNITY): Payer: Managed Care, Other (non HMO)

## 2012-01-07 LAB — IGG, IGA, IGM
IgA: 193 mg/dL (ref 69–380)
IgG (Immunoglobin G), Serum: 1390 mg/dL (ref 690–1700)

## 2012-01-07 LAB — EPSTEIN-BARR VIRUS VCA, IGM: EBV VCA IgM: 0.87 {ISR}

## 2012-01-07 LAB — EPSTEIN-BARR VIRUS VCA, IGG: EBV VCA IgG: 4.59 {ISR} — ABNORMAL HIGH

## 2012-01-18 ENCOUNTER — Other Ambulatory Visit (HOSPITAL_COMMUNITY): Payer: Self-pay | Admitting: Pediatrics

## 2012-01-18 DIAGNOSIS — Z139 Encounter for screening, unspecified: Secondary | ICD-10-CM

## 2012-02-04 ENCOUNTER — Ambulatory Visit (INDEPENDENT_AMBULATORY_CARE_PROVIDER_SITE_OTHER): Payer: Managed Care, Other (non HMO) | Admitting: Otolaryngology

## 2012-02-04 DIAGNOSIS — J343 Hypertrophy of nasal turbinates: Secondary | ICD-10-CM

## 2012-02-04 DIAGNOSIS — J310 Chronic rhinitis: Secondary | ICD-10-CM

## 2012-02-17 ENCOUNTER — Other Ambulatory Visit (HOSPITAL_COMMUNITY): Payer: Managed Care, Other (non HMO)

## 2012-02-18 ENCOUNTER — Ambulatory Visit (HOSPITAL_COMMUNITY)
Admission: RE | Admit: 2012-02-18 | Discharge: 2012-02-18 | Disposition: A | Discharge status: Home / Self Care | Payer: Managed Care, Other (non HMO) | Source: Ambulatory Visit | Attending: Pediatrics | Admitting: Pediatrics

## 2012-02-18 DIAGNOSIS — Z1231 Encounter for screening mammogram for malignant neoplasm of breast: Secondary | ICD-10-CM | POA: Insufficient documentation

## 2012-02-18 DIAGNOSIS — Z139 Encounter for screening, unspecified: Secondary | ICD-10-CM

## 2012-02-19 ENCOUNTER — Ambulatory Visit (HOSPITAL_COMMUNITY): Payer: Managed Care, Other (non HMO) | Admitting: Oncology

## 2012-03-09 ENCOUNTER — Ambulatory Visit (INDEPENDENT_AMBULATORY_CARE_PROVIDER_SITE_OTHER): Payer: Managed Care, Other (non HMO)

## 2012-03-09 DIAGNOSIS — J309 Allergic rhinitis, unspecified: Secondary | ICD-10-CM

## 2012-03-10 ENCOUNTER — Ambulatory Visit (INDEPENDENT_AMBULATORY_CARE_PROVIDER_SITE_OTHER): Payer: Managed Care, Other (non HMO) | Admitting: Otolaryngology

## 2012-03-10 DIAGNOSIS — J310 Chronic rhinitis: Secondary | ICD-10-CM

## 2012-03-18 ENCOUNTER — Encounter: Payer: Self-pay | Admitting: Gastroenterology

## 2012-04-13 ENCOUNTER — Ambulatory Visit: Payer: Managed Care, Other (non HMO) | Admitting: Internal Medicine

## 2012-04-15 ENCOUNTER — Encounter: Payer: Self-pay | Admitting: Gastroenterology

## 2012-04-28 ENCOUNTER — Ambulatory Visit: Payer: Managed Care, Other (non HMO) | Admitting: Internal Medicine

## 2012-06-09 ENCOUNTER — Encounter: Payer: Self-pay | Admitting: Internal Medicine

## 2012-06-09 ENCOUNTER — Ambulatory Visit (INDEPENDENT_AMBULATORY_CARE_PROVIDER_SITE_OTHER): Payer: Managed Care, Other (non HMO) | Admitting: Otolaryngology

## 2012-06-09 ENCOUNTER — Telehealth: Payer: Self-pay | Admitting: Internal Medicine

## 2012-06-09 ENCOUNTER — Ambulatory Visit (INDEPENDENT_AMBULATORY_CARE_PROVIDER_SITE_OTHER): Payer: Managed Care, Other (non HMO) | Admitting: Internal Medicine

## 2012-06-09 VITALS — BP 122/72 | HR 83 | Ht 61.0 in | Wt 128.0 lb

## 2012-06-09 DIAGNOSIS — J3089 Other allergic rhinitis: Secondary | ICD-10-CM

## 2012-06-09 DIAGNOSIS — J343 Hypertrophy of nasal turbinates: Secondary | ICD-10-CM

## 2012-06-09 DIAGNOSIS — B370 Candidal stomatitis: Secondary | ICD-10-CM

## 2012-06-09 DIAGNOSIS — J45998 Other asthma: Secondary | ICD-10-CM

## 2012-06-09 DIAGNOSIS — J302 Other seasonal allergic rhinitis: Secondary | ICD-10-CM

## 2012-06-09 DIAGNOSIS — J45909 Unspecified asthma, uncomplicated: Secondary | ICD-10-CM

## 2012-06-09 DIAGNOSIS — J0100 Acute maxillary sinusitis, unspecified: Secondary | ICD-10-CM

## 2012-06-09 DIAGNOSIS — J310 Chronic rhinitis: Secondary | ICD-10-CM

## 2012-06-09 DIAGNOSIS — J309 Allergic rhinitis, unspecified: Secondary | ICD-10-CM

## 2012-06-09 NOTE — Telephone Encounter (Signed)
[id number] to verify what the pt needing. Aerochamber? Carron Curie, CMA

## 2012-06-09 NOTE — Progress Notes (Signed)
10/14/11- 40 yoF never smoker followed for allergic rhinitis, rhinosinusitis.  PCP Lubertha South LOV- 06/19/10 She comes alone this visit but has many small children and is considering having another. Has had flu vaccine. Did well through the summer. With onset of early fall weather, began noting more of thick mucus. Frequent colds cycle through her family. She doesn't know when her mild sense of head and chest congestion may be from a viral infection. She now has a nebulizer machine with albuterol. This helps "some, a little". Sticky sensation doesn't go away. Can occasionally cough up a little white phlegm. Mucinex helps. We discussed the importance of hydration as the indoor heat comes on and she admits she probably does not drink enough fluid.  06/09/12- 40 yoF never smoker followed for allergic rhinitis, rhinosinusitis. Asthma       PCP Lubertha South Pt states having some chest congestion,productive cough,sob,denies any wheezing. Pt states still  taking allergy injections at home . She comes today with 4 small children. She feels as if she is "breathing through a straw" at times but never notices wheezing. A few colds. Occasional cough. Throat feels "sticky". She uses rescue inhaler before exercise. Recognizes no problems with her allergy vaccine.  ROS-see HPI Constitutional:   No-   weight loss, night sweats, fevers, chills, fatigue, lassitude. HEENT:   No-  headaches, difficulty swallowing, tooth/dental problems, sore throat,       No-  sneezing, itching, ear ache, +nasal congestion, post nasal drip,  CV:  No-   chest pain, orthopnea, PND, swelling in lower extremities, anasarca, dizziness, palpitations Resp: + tightness/ shortness of breath with exertion or at rest.              No-   productive cough,  No non-productive cough,  No- coughing up of blood.              No-   change in color of mucus.  No- wheezing.   Skin: No-   rash or lesions. GI:  No-   heartburn, indigestion, abdominal pain,  nausea, vomiting,  GU:  MS:  No-   joint pain or swelling.   Neuro-     nothing unusual Psych:  No- change in mood or affect. No depression or anxiety.  No memory loss.   OBJ General- Alert, Oriented, Affect-appropriate, Distress- none acute, muscular Skin- rash-none, lesions- none, excoriation- none Lymphadenopathy- none Head- atraumatic            Eyes- Gross vision intact, PERRLA, conjunctivae clear secretions            Ears- Hearing, canals-normal            Nose- Clear, no-Septal dev, mucus, polyps, erosion, perforation             Throat- Mallampati II , +mucosa pale , drainage- none, tonsils- atrophic. +Thrush Neck- flexible , trachea midline, no stridor , thyroid nl, carotid no bruit Chest - symmetrical excursion , unlabored           Heart/CV- RRR , no murmur , no gallop  , no rub, nl s1 s2                           - JVD- none , edema- none, stasis changes- none, varices- none           Lung- clear to P&A, wheeze- none, cough- none , dullness-none, rub- none  Chest wall-  Abd-  Br/ Gen/ Rectal- Not done, not indicated Extrem- cyanosis- none, clubbing, none, atrophy- none, strength- nl Neuro- grossly intact to observation

## 2012-06-09 NOTE — Patient Instructions (Addendum)
Sample Dymista nasal spray -   1 or 2 puffs each nostril once daily at bedtime. Try this instead of your nasonex and astepro for now, for comparison.

## 2012-06-10 MED ORDER — AEROCHAMBER MV MISC: Status: DC

## 2012-06-10 NOTE — Telephone Encounter (Signed)
Pt needs a new aerochamber. This is on her med list so I advised I will leave one at the front for her to pick-up. Rx printed and given to [facility]. Carron Curie, CMA

## 2012-06-15 NOTE — Assessment & Plan Note (Signed)
Generally well controlled. Plan- try a sample of Dymista nasal spray

## 2012-06-15 NOTE — Assessment & Plan Note (Addendum)
Not sure all of her sustained, nonexertional chest tightness is asthma. We discussed exercise and her medications. With small children she is certainly exposed to colds. Plan-consider spirometry

## 2012-06-15 NOTE — Assessment & Plan Note (Signed)
Oral thrush on exam today. We discussed mouth care and steroid inhaler use. Offered specific therapy if this does not clear quickly

## 2012-06-17 ENCOUNTER — Telehealth: Payer: Self-pay | Admitting: Internal Medicine

## 2012-06-17 NOTE — Telephone Encounter (Signed)
Spoke with pt and she states that the dog ate her neb mouthpiece. She requests to pick up one from here b/c it is time to use med. I have let one up front for pick up. Pt states nothing further needed.

## 2012-06-24 ENCOUNTER — Telehealth: Payer: Self-pay | Admitting: Internal Medicine

## 2012-06-24 MED ORDER — FLUCONAZOLE 150 MG PO TABS: 150.0000 mg | ORAL_TABLET | Freq: Every day | ORAL | Status: AC

## 2012-06-24 NOTE — Telephone Encounter (Signed)
Per Dr. Maple Hudson; 1) Diflucan 150 # 5 1 daily  2) Mucinex Dm  3) Use her nebulizer up to 4 times daily prn  4) Wait on further antibiotics, see how she feels next week.  Thanks

## 2012-06-24 NOTE — Telephone Encounter (Signed)
Called spoke with patient, advised of CY's recs.  Pt verbalized her understanding and will call back next week w/ an update if she is not any better.  Diflucan sent to verified pharmacy.

## 2012-06-24 NOTE — Telephone Encounter (Signed)
Called spoke with patient who c/o chest congestion, cloudy/milky/brownish/green mucus, wheezing, some SOB, hard to inhale fully, "lungs really irritated and dry", tightness in chest, chills, achy, "yeast areas are raging" > worse since last ov.  Has been using the dymista given at last ov "pretty regularly."  Dr Maple Hudson please advise, thanks.  [facility]  Allergen Reactions  . Sulfonamide Derivatives Anaphylaxis   Last ov: 7.25.13

## 2012-07-14 ENCOUNTER — Ambulatory Visit (INDEPENDENT_AMBULATORY_CARE_PROVIDER_SITE_OTHER): Payer: Managed Care, Other (non HMO) | Admitting: Otolaryngology

## 2012-07-14 DIAGNOSIS — J0100 Acute maxillary sinusitis, unspecified: Secondary | ICD-10-CM

## 2012-07-14 DIAGNOSIS — J343 Hypertrophy of nasal turbinates: Secondary | ICD-10-CM

## 2012-07-14 DIAGNOSIS — J310 Chronic rhinitis: Secondary | ICD-10-CM

## 2012-07-15 ENCOUNTER — Other Ambulatory Visit: Payer: Self-pay | Admitting: Otolaryngology

## 2012-08-12 ENCOUNTER — Other Ambulatory Visit (HOSPITAL_COMMUNITY): Payer: Self-pay | Admitting: Physician Assistant

## 2012-08-12 DIAGNOSIS — K219 Gastro-esophageal reflux disease without esophagitis: Secondary | ICD-10-CM

## 2012-08-12 DIAGNOSIS — I499 Cardiac arrhythmia, unspecified: Secondary | ICD-10-CM

## 2012-08-12 DIAGNOSIS — R6889 Other general symptoms and signs: Secondary | ICD-10-CM

## 2012-08-15 ENCOUNTER — Ambulatory Visit (HOSPITAL_COMMUNITY): Payer: Managed Care, Other (non HMO) | Attending: Physician Assistant

## 2012-08-18 ENCOUNTER — Ambulatory Visit (INDEPENDENT_AMBULATORY_CARE_PROVIDER_SITE_OTHER): Payer: Managed Care, Other (non HMO)

## 2012-08-18 DIAGNOSIS — J309 Allergic rhinitis, unspecified: Secondary | ICD-10-CM

## 2012-09-16 ENCOUNTER — Encounter: Payer: Self-pay | Admitting: Gastroenterology

## 2012-09-19 ENCOUNTER — Encounter: Payer: Self-pay | Admitting: Gastroenterology

## 2012-09-19 ENCOUNTER — Ambulatory Visit (INDEPENDENT_AMBULATORY_CARE_PROVIDER_SITE_OTHER): Payer: Managed Care, Other (non HMO) | Admitting: Gastroenterology

## 2012-09-19 VITALS — BP 132/78 | HR 91 | Temp 97.4°F | Ht 61.0 in | Wt 125.2 lb

## 2012-09-19 DIAGNOSIS — R1310 Dysphagia, unspecified: Secondary | ICD-10-CM

## 2012-09-19 DIAGNOSIS — D126 Benign neoplasm of colon, unspecified: Secondary | ICD-10-CM

## 2012-09-19 MED ORDER — RABEPRAZOLE SODIUM 20 MG PO TBEC: 20.0000 mg | DELAYED_RELEASE_TABLET | Freq: Two times a day (BID) | ORAL | Status: DC

## 2012-09-19 NOTE — Progress Notes (Signed)
Referring Provider: [name], PA-C Primary Care Physician:  Dorris Fetch, PA Primary Gastroenterologist: Dr. Darrick Penna   Chief Complaint  Patient presents with  . Heartburn    HPI:   41 year old female who presents today with hx of IBS and GERD. TCS and EGD performed by Dr. Darrick Penna Feb 2013. Adenomas, needs surveillance 2018. EGD essentially normal, with mild gastritis. On Omeprazole BID.  Notes having some reflux issues. Lots of burning/belching. Notes heavy sensation, getting full breath is difficult. +dysphagia, no odynophagia. TSH number elevated. Going to have bloodwork again, meds. Omeprazole BID. In past had Protonix, Nexium. ?dexilant.  Tries not to eat 2 hours before sleeping. Feels like throat is closing sometimes. +burning. Had a couple rounds of abx about a month ago.   Hx of oral thrush.   Past Medical History  Diagnosis Date  . Chronic rhinosinusitis   . Allergic rhinitis   . Cough   . Cardiac murmur   . HSV infection     History of-outbreak during pregnancy 2004  . Mono exposure     Had in January  . Thrush, oral   . Yeast infection of the vagina   . Mononucleosis 01/06/2012    Past Surgical History  Procedure Date  . Cesarean section 2002,2004,2008, 2009  . Nasal sinus surgery 2005  . Colonoscopy 12/25/2011    Dr. Darrick Penna: small internal hemorrhoids, adenomas, surveillance in 2018  . Esophagogastroduodenoscopy Feb 2013    Dr. Darrick Penna: normal stomach, duodenum, esophagus. Gastritis. hiatal hernia    Current Outpatient Prescriptions  Medication Sig Dispense Refill  . albuterol (PROVENTIL HFA) 108 (90 BASE) MCG/ACT inhaler Inhale 2 puffs into the lungs 4 (four) times daily as needed. For shortness of breath      . albuterol (PROVENTIL) (2.5 MG/3ML) 0.083% nebulizer solution Take 2.5 mg by nebulization every 6 (six) hours as needed. For shortness of breath      . Azelastine HCl (ASTEPRO) 0.15 % SOLN Place 2 puffs into the nose at bedtime.         . beclomethasone (QVAR) 80 MCG/ACT inhaler Inhale 2 puffs into the lungs 2 (two) times daily.        . cetirizine (ZYRTEC) 10 MG tablet Take 10 mg by mouth daily.        Marland Kitchen EPINEPHrine (EPIPEN) 0.3 mg/0.3 mL DEVI Inject 0.3 mg into the muscle as needed. For allergic reaction      . guaiFENesin (MUCINEX) 600 MG 12 hr tablet Take 1,200 mg by mouth 2 (two) times daily.        . LO LOESTRIN FE 1 MG-10 MCG / 10 MCG tablet       . mometasone (NASONEX) 50 MCG/ACT nasal spray Place 2 sprays into the nose daily.        . montelukast (SINGULAIR) 10 MG tablet Take 10 mg by mouth at bedtime.        . pantoprazole (PROTONIX) 40 MG tablet Take 40 mg by mouth daily.       Marland Kitchen Spacer/Aero-Holding Chambers (AEROCHAMBER MV) inhaler Use as instructed  1 each  0  . fluconazole (DIFLUCAN) 150 MG tablet       . Multiple Vitamin (MULITIVITAMIN WITH MINERALS) TABS Take 1 tablet by mouth daily.      Marland Kitchen omeprazole (PRILOSEC) 20 MG capsule 1 PO 30 MINUTES PRIOR TO MEALS TWICE DAILY  62 capsule  11  . RABEprazole (ACIPHEX) 20 MG tablet Take 1 tablet (20 mg total) by mouth 2 (two) times  daily.  60 tablet  3    Allergies as of 09/19/2012 - Review Complete 09/19/2012  Allergen Reaction Noted  . Sulfonamide derivatives Anaphylaxis     Family History  Problem Relation Age of Onset  . Allergies Sister   . Allergies Brother   . Cervical cancer Mother   . Colon polyps Mother 78    several "benign" polyps  . Cancer Mother   . Breast cancer      grandmother  . Colon cancer Neg Hx   . Cancer Maternal Grandmother     History   Social History  . Marital Status: Married    Spouse Name: N/A    Number of Children: 4  . Years of Education: N/A   Occupational History  . Housewife-recent accounting degree.    Social History Main Topics  . Smoking status: Never Smoker   . Smokeless tobacco: Never Used  . Alcohol Use: No  . Drug Use: No  . Sexually Active: None   Other Topics Concern  . None   Social History  Narrative  . None    Review of Systems: Gen: Denies fever, chills, anorexia. Denies fatigue, weakness, weight loss.  CV: Denies chest pain, palpitations, syncope, peripheral edema, and claudication. Resp: Denies dyspnea at rest, cough, wheezing, coughing up blood, and pleurisy. GI: SEE HPI Derm: Denies rash, itching, dry skin Psych: Denies depression, anxiety, memory loss, confusion. No homicidal or suicidal ideation.  Heme: Denies bruising, bleeding, and enlarged lymph nodes.  Physical Exam: BP 132/78  Pulse 91  Temp 97.4 F (36.3 C) (Temporal)  Ht 5\' 1"  (1.549 m)  Wt 125 lb 3.2 oz (56.79 kg)  BMI 23.66 kg/m2  LMP 05/19/2012 General:   Alert and oriented. No distress noted. Pleasant and cooperative.  Head:  Normocephalic and atraumatic. Eyes:  Conjuctiva clear without scleral icterus. Mouth:  Oral mucosa pink and moist. Good dentition. No lesions. Heart:  S1, S2 present without murmurs, rubs, or gallops. Regular rate and rhythm. Abdomen:  +BS, soft, non-tender and non-distended. No rebound or guarding. No HSM or masses noted. ? Small UH. Msk:  Symmetrical without gross deformities. Normal posture. Extremities:  Without edema. Neurologic:  Alert and  oriented x4;  grossly normal neurologically. Skin:  Intact without significant lesions or rashes. Psych:  Alert and cooperative. Normal mood and affect.

## 2012-09-19 NOTE — Patient Instructions (Addendum)
Stop Omeprazole. Start Aciphex one daily. You may increase to twice a day if needed. I have sent a prescription to your pharmacy (we don't have any samples, I'm sorry!)  Complete the swallow study. We will be able to know more once this is obtained.  If your symptoms worsen or stay the same despite the Aciphex and follow the diet, please call us.  Otherwise, we will see you in 3 months.     Diet for Gastroesophageal Reflux Disease, Adult Reflux (acid reflux) is when acid from your stomach flows up into the esophagus. When acid comes in contact with the esophagus, the acid causes irritation and soreness (inflammation) in the esophagus. When reflux happens often or so severely that it causes damage to the esophagus, it is called gastroesophageal reflux disease (GERD). Nutrition therapy can help ease the discomfort of GERD. FOODS OR DRINKS TO AVOID OR LIMIT  Smoking or chewing tobacco. Nicotine is one of the most potent stimulants to acid production in the gastrointestinal tract.   Caffeinated and decaffeinated coffee and black tea.   Regular or low-calorie carbonated beverages or energy drinks (caffeine-free carbonated beverages are allowed).     Strong spices, such as black pepper, white pepper, red pepper, cayenne, curry powder, and chili powder.   Peppermint or spearmint.   Chocolate.   High-fat foods, including meats and fried foods. Extra added fats including oils, butter, salad dressings, and nuts. Limit these to less than 8 tsp per day.   Fruits and vegetables if they are not tolerated, such as citrus fruits or tomatoes.   Alcohol.   Any food that seems to aggravate your condition.  If you have questions regarding your diet, call your caregiver or a registered dietitian. OTHER THINGS THAT MAY HELP GERD INCLUDE:    Eating your meals slowly, in a relaxed setting.   Eating 5 to 6 small meals per day instead of 3 large meals.   Eliminating food for a period of time if it  causes distress.   Not lying down until 3 hours after eating a meal.   Keeping the head of your bed raised 6 to 9 inches (15 to 23 cm) by using a foam wedge or blocks under the legs of the bed. Lying flat may make symptoms worse.   Being physically active. Weight loss may be helpful in reducing reflux in overweight or obese adults.   Wear loose fitting clothing  EXAMPLE MEAL PLAN This meal plan is approximately 2,000 calories based on https://www.bernard.org/ meal planning guidelines. Breakfast   cup cooked oatmeal.   1 cup strawberries.   1 cup low-fat milk.   1 oz almonds.  Snack  1 cup cucumber slices.   6 oz yogurt (made from low-fat or fat-free milk).  Lunch  2 slice whole-wheat bread.   2 oz sliced [place].   2 tsp mayonnaise.   1 cup blueberries.   1 cup snap peas.  Snack  6 whole-wheat crackers.   1 oz string cheese.  Dinner   cup brown rice.   1 cup mixed veggies.   1 tsp olive oil.   3 oz grilled fish.  Document Released: 11/02/2005 Document Revised: 01/25/2012 Document Reviewed: 09/18/2011 [facility] Patient Information 2013 [place], [place].

## 2012-09-20 ENCOUNTER — Ambulatory Visit (HOSPITAL_COMMUNITY)
Admission: RE | Admit: 2012-09-20 | Discharge: 2012-09-20 | Disposition: A | Discharge status: Home / Self Care | Payer: Managed Care, Other (non HMO) | Source: Ambulatory Visit | Attending: Gastroenterology | Admitting: Gastroenterology

## 2012-09-20 DIAGNOSIS — R1310 Dysphagia, unspecified: Secondary | ICD-10-CM | POA: Insufficient documentation

## 2012-09-22 ENCOUNTER — Encounter: Payer: Self-pay | Admitting: Gastroenterology

## 2012-09-22 DIAGNOSIS — D126 Benign neoplasm of colon, unspecified: Secondary | ICD-10-CM | POA: Insufficient documentation

## 2012-09-22 DIAGNOSIS — R1310 Dysphagia, unspecified: Secondary | ICD-10-CM | POA: Insufficient documentation

## 2012-09-22 NOTE — Assessment & Plan Note (Signed)
Surveillance 2018.  

## 2012-09-22 NOTE — Assessment & Plan Note (Addendum)
41 year old with EGD in Feb 2013, no source of dysphagia. Continued reflux symptoms, tightness. Unclear etiology at this point. Hx of oral thrush, but no evidence on physical exam. Question esophageal dysmotility disorder, uncontrolled GERD, ?candida esophagitis?  Switch from Prilosec BID to Aciphex daily. May need to increase to BID UGI/BPE in future GERD diet 3 mos f/u ?empiric course of Diflucan? Consider tertiary referral

## 2012-09-22 NOTE — Progress Notes (Signed)
Faxed to PCP

## 2012-09-23 ENCOUNTER — Telehealth: Payer: Self-pay | Admitting: Gastroenterology

## 2012-09-23 NOTE — Telephone Encounter (Signed)
Patient is calling asking for results of her BPE and UGI please advise?

## 2012-09-26 ENCOUNTER — Telehealth: Payer: Self-pay | Admitting: Gastroenterology

## 2012-09-26 NOTE — Telephone Encounter (Signed)
Pt has called again today asking if her results were back and she also has questions about her medications.

## 2012-09-26 NOTE — Telephone Encounter (Signed)
LMOM for pt to call with questions. ( Wiill call her when I have the results of tests).

## 2012-09-26 NOTE — Telephone Encounter (Signed)
LMOM not available yet.

## 2012-09-27 ENCOUNTER — Telehealth: Payer: Self-pay

## 2012-09-27 NOTE — Telephone Encounter (Signed)
Dexilant samples at front for pt ( #15 ) and pt is aware.

## 2012-09-27 NOTE — Telephone Encounter (Signed)
Let's try Dexilant samples. If these work for her, could just continue Dexilant. PR in 1 week

## 2012-09-27 NOTE — Telephone Encounter (Signed)
Pt called and has not gotten her Aciphex yet. Raynelle Fanning has done PA and faxed it in and hasn't heard from it. Pt would like to know if she can get a few samples of something to take while she is waiting. Please advise!

## 2012-09-27 NOTE — Progress Notes (Signed)
Quick Note:  Called and informed pt of results. ______ 

## 2012-09-27 NOTE — Telephone Encounter (Signed)
See separate note of 09/27/2012.

## 2012-09-27 NOTE — Progress Notes (Signed)
Quick Note:  No obstruction of tablet. No evidence of dysmotility. Will change PPI.   ______

## 2012-09-28 ENCOUNTER — Telehealth: Payer: Self-pay | Admitting: *Deleted

## 2012-09-28 MED ORDER — FLUCONAZOLE 150 MG PO TABS: 150.0000 mg | ORAL_TABLET | Freq: Once | ORAL | Status: DC

## 2012-09-28 NOTE — Telephone Encounter (Signed)
Called and informed pt.  

## 2012-09-28 NOTE — Telephone Encounter (Signed)
Called pt. She said that she called her PCP as Tobi Bastos had suggested for congestion and they called her in an antibiotic but they will not call her in Diflucan for yeast infection. She would like to know if Tobi Bastos will call it in for her. Please advise!

## 2012-09-28 NOTE — Addendum Note (Signed)
Addended by: Nira Retort on: 09/28/2012 02:24 PM   Modules accepted: Orders

## 2012-09-28 NOTE — Telephone Encounter (Signed)
I sent in prescription for Diflucan X 1.

## 2012-09-28 NOTE — Telephone Encounter (Signed)
Ms Clinkscale called today. When she was seen here last week, AS suggested for her to be put on antibiotics for congestion. Now the patient has a yeast infection and her PCP will not order her diflucan.  The patient would like to know if there is anything we can do for her. Thanks.

## 2012-09-29 ENCOUNTER — Ambulatory Visit: Payer: Managed Care, Other (non HMO) | Admitting: Gastroenterology

## 2012-09-29 NOTE — Telephone Encounter (Signed)
Pt was informed of test results on 09/27/2012. ( See separate note).

## 2012-09-29 NOTE — Progress Notes (Signed)
REVIEWED.  NL BPE NOV 2013

## 2012-12-20 ENCOUNTER — Ambulatory Visit: Payer: Managed Care, Other (non HMO) | Admitting: Urgent Care

## 2013-01-02 ENCOUNTER — Other Ambulatory Visit: Payer: Self-pay | Admitting: Obstetrics & Gynecology

## 2013-01-02 ENCOUNTER — Telehealth: Payer: Self-pay | Admitting: Internal Medicine

## 2013-01-02 MED ORDER — "TUBERCULIN-ALLERGY SYRINGES 28G X 1/2"" 1 ML MISC": Status: DC

## 2013-01-02 NOTE — Telephone Encounter (Signed)
Spoke with pt needed rx for bd allergy syringes. rx called in an pt is aware.

## 2013-01-09 ENCOUNTER — Other Ambulatory Visit (HOSPITAL_COMMUNITY): Payer: Self-pay | Admitting: Family Medicine

## 2013-01-09 DIAGNOSIS — Z139 Encounter for screening, unspecified: Secondary | ICD-10-CM

## 2013-01-24 ENCOUNTER — Telehealth: Payer: Self-pay | Admitting: Internal Medicine

## 2013-01-24 DIAGNOSIS — J45909 Unspecified asthma, uncomplicated: Secondary | ICD-10-CM

## 2013-01-24 NOTE — Telephone Encounter (Signed)
lmomtcb for pt 

## 2013-01-24 NOTE — Telephone Encounter (Signed)
[phone] returning  A call back

## 2013-01-24 NOTE — Telephone Encounter (Signed)
Pt returned call.  Holly D Pryor ° °

## 2013-01-24 NOTE — Telephone Encounter (Signed)
ATC patient, no answer LMOMTCB 

## 2013-01-24 NOTE — Telephone Encounter (Signed)
LMTCB

## 2013-01-25 MED ORDER — AZELASTINE-FLUTICASONE 137-50 MCG/ACT NA SUSP: 1.0000 | Freq: Every day | NASAL | Status: DC

## 2013-01-25 MED ORDER — ALBUTEROL SULFATE (2.5 MG/3ML) 0.083% IN NEBU: 2.5000 mg | INHALATION_SOLUTION | Freq: Four times a day (QID) | RESPIRATORY_TRACT | Status: DC | PRN

## 2013-01-25 NOTE — Telephone Encounter (Signed)
I spoke the pt and she is requesting an order for a neb machine be sent to [facility] phone number [phone]. I do not see where we have done any orders for this in the past so I advised I will send a message to CY to make sure this is ok to order. Pt states understanding. Refill sent for the pt on dymista. Please advise. Carron Curie, CMA

## 2013-01-25 NOTE — Telephone Encounter (Signed)
LMTCB

## 2013-01-25 NOTE — Telephone Encounter (Signed)
Pt is aware of CY recommendation. I will place an order for this to go to our [facility].

## 2013-01-25 NOTE — Telephone Encounter (Signed)
She had a nebulizer machine from somewhere, with albuterol, according to my old notes.  Ok to order her a nebulizer compressor machine for dx of asthma with bronchitis  If needed, we can order albuterol neb solution 0.083   # 50 (3 ml ampules) 1 every 6 hours as needed

## 2013-01-27 ENCOUNTER — Other Ambulatory Visit: Payer: Self-pay | Admitting: Internal Medicine

## 2013-01-27 DIAGNOSIS — J45909 Unspecified asthma, uncomplicated: Secondary | ICD-10-CM

## 2013-02-21 ENCOUNTER — Telehealth: Payer: Self-pay | Admitting: Internal Medicine

## 2013-02-21 NOTE — Telephone Encounter (Signed)
I spoke with pt and she stated it has been 3 years since she has started her allergy vaccines. Her PCP had recommenced maybe she should have another skin test to re evaluate for allergies. Pt thinks it would be a good idea as well. Please advise Dr. Maple Hudson thanks Last OV 06/09/12 No pending APPT

## 2013-02-21 NOTE — Telephone Encounter (Signed)
Ok to schedule allergy skin testing. Needs usual instruction to be off antihistamines for 3 days. Needs not to bring children for this visit.

## 2013-02-21 NOTE — Telephone Encounter (Signed)
Spoke with patient-aware of allergy testing protocol and no children due to needle usage. Pt has scheduled ALT for Wednesday June 4,2014 at 2:30pm. Nothing further needed at this time; will sign off on message.

## 2013-02-27 ENCOUNTER — Ambulatory Visit (HOSPITAL_COMMUNITY)
Admission: RE | Admit: 2013-02-27 | Discharge: 2013-02-27 | Disposition: A | Discharge status: Home / Self Care | Payer: Managed Care, Other (non HMO) | Source: Ambulatory Visit | Attending: Family Medicine | Admitting: Family Medicine

## 2013-02-27 DIAGNOSIS — Z1231 Encounter for screening mammogram for malignant neoplasm of breast: Secondary | ICD-10-CM | POA: Insufficient documentation

## 2013-02-27 DIAGNOSIS — Z139 Encounter for screening, unspecified: Secondary | ICD-10-CM

## 2013-03-20 ENCOUNTER — Telehealth: Payer: Self-pay | Admitting: Internal Medicine

## 2013-03-20 NOTE — Telephone Encounter (Signed)
I spoke with pt. She wanted to know if OTC SLEEP AID was allowed prior to allergy testing. i advised her no OTC sleep aids at all was allowed. Nothing further was needed

## 2013-03-22 ENCOUNTER — Ambulatory Visit: Payer: Managed Care, Other (non HMO) | Admitting: Internal Medicine

## 2013-03-23 ENCOUNTER — Ambulatory Visit (INDEPENDENT_AMBULATORY_CARE_PROVIDER_SITE_OTHER): Payer: Managed Care, Other (non HMO) | Admitting: Otolaryngology

## 2013-03-29 ENCOUNTER — Ambulatory Visit (INDEPENDENT_AMBULATORY_CARE_PROVIDER_SITE_OTHER): Payer: Managed Care, Other (non HMO) | Admitting: Gastroenterology

## 2013-03-29 ENCOUNTER — Encounter: Payer: Self-pay | Admitting: Gastroenterology

## 2013-03-29 VITALS — BP 133/72 | HR 101 | Temp 97.6°F | Ht 62.0 in | Wt 120.2 lb

## 2013-03-29 DIAGNOSIS — K219 Gastro-esophageal reflux disease without esophagitis: Secondary | ICD-10-CM

## 2013-03-29 MED ORDER — RABEPRAZOLE SODIUM 20 MG PO TBEC: 20.0000 mg | DELAYED_RELEASE_TABLET | Freq: Two times a day (BID) | ORAL | Status: DC

## 2013-03-29 NOTE — Progress Notes (Signed)
Cc PCP 

## 2013-03-29 NOTE — Progress Notes (Signed)
Primary Care Physician: Pershing Proud  Primary Gastroenterologist:  Jonette Eva, MD   Chief Complaint  Patient presents with  . Gastrophageal Reflux    HPI: [name] [name] is a 42 y.o. female here for followup of GERD. She was last seen in November 2013. She has a history of IBS, GERD. Colonoscopy and EGD performed by Dr. Darrick Penna in February 2013. Adenomas, need surveillance in 2018. EGD essentially normal with mild gastritis. Small hiatal hernia. Has been omeprazole 20 mg twice a day. In the past has tried protonix, Nexium. At last office visit we started her on AcipHex 20 mg twice a day. While prior authorization was been obtained she was provided with Dexilant samples. She had a normal barium esophagram in November of last year. No hiatal hernia seen.  Feels fullness in chest with deep breath. No heartburn. BM most days. Metamucil once per day. Some uncomfortable. No blood in stool. No weight loss. Weight fluctuates between 120-125lb. No abdominal pain. Denies dysphagia. Terrible time with congestion/allergies. Seeing her allergist in the next few weeks.    Current Outpatient Prescriptions  Medication Sig Dispense Refill  . albuterol (PROVENTIL HFA) 108 (90 BASE) MCG/ACT inhaler Inhale 2 puffs into the lungs 4 (four) times daily as needed. For shortness of breath      . albuterol (PROVENTIL) (2.5 MG/3ML) 0.083% nebulizer solution Take 3 mLs (2.5 mg total) by nebulization every 6 (six) hours as needed. For shortness of breath  75 mL  5  . Azelastine HCl (ASTEPRO) 0.15 % SOLN Place 2 puffs into the nose at bedtime.        . Azelastine-Fluticasone (DYMISTA) 137-50 MCG/ACT SUSP Place 1-2 sprays into the nose at bedtime.  23 g  6  . beclomethasone (QVAR) 80 MCG/ACT inhaler Inhale 2 puffs into the lungs 2 (two) times daily.        . cetirizine (ZYRTEC) 10 MG tablet Take 10 mg by mouth daily.        Marland Kitchen EPINEPHrine (EPIPEN) 0.3 mg/0.3 mL DEVI Inject 0.3 mg into the muscle as needed. For  allergic reaction      . fluconazole (DIFLUCAN) 150 MG tablet Take 1 tablet (150 mg total) by mouth once.  2 tablet  0  . guaiFENesin (MUCINEX) 600 MG 12 hr tablet Take 1,200 mg by mouth 2 (two) times daily.        Marland Kitchen levothyroxine (SYNTHROID, LEVOTHROID) 25 MCG tablet Take 25 mcg by mouth daily before breakfast.       . LO LOESTRIN FE 1 MG-10 MCG / 10 MCG tablet Take 1 tablet by mouth daily.       . mometasone (NASONEX) 50 MCG/ACT nasal spray Place 2 sprays into the nose daily.        . montelukast (SINGULAIR) 10 MG tablet Take 10 mg by mouth at bedtime.        . Multiple Vitamin (MULITIVITAMIN WITH MINERALS) TABS Take 1 tablet by mouth daily.      Marland Kitchen omeprazole (PRILOSEC) 20 MG capsule 1 PO 30 MINUTES PRIOR TO MEALS TWICE DAILY  62 capsule  11  . RABEprazole (ACIPHEX) 20 MG tablet Take 1 tablet (20 mg total) by mouth 2 (two) times daily.  60 tablet  3  . Spacer/Aero-Holding Chambers (AEROCHAMBER MV) inhaler Use as instructed  1 each  0   No current facility-administered medications for this visit.    Allergies as of 03/29/2013 - Review Complete 03/29/2013  Allergen Reaction Noted  . Sulfonamide derivatives Anaphylaxis  ROS:  General: Negative for anorexia, weight loss, fever, chills, fatigue, weakness. ENT: Negative for hoarseness, difficulty swallowing. + nasal congestion. CV: Negative for chest pain, angina, palpitations, dyspnea on exertion, peripheral edema.  Respiratory: Negative for dyspnea at rest, dyspnea on exertion. + cough.  GI: See history of present illness. GU:  Negative for dysuria, hematuria, urinary incontinence, urinary frequency, nocturnal urination.  Endo: Negative for unusual weight change.    Physical Examination:   BP 133/72  Pulse 101  Temp(Src) 97.6 F (36.4 C) (Oral)  Ht 5\' 2"  (1.575 m)  Wt 120 lb 3.2 oz (54.522 kg)  BMI 21.98 kg/m2  LMP 01/19/2013  General: Well-nourished, well-developed in no acute distress. Two young children present  today. Eyes: No icterus. Mouth: Oropharyngeal mucosa moist and pink , no lesions erythema or exudate. Lungs: Clear to auscultation bilaterally.  Heart: Regular rate and rhythm, no murmurs rubs or gallops.  Abdomen: Bowel sounds are normal, nontender, nondistended, no hepatosplenomegaly or masses, no abdominal bruits or hernia , no rebound or guarding.   Extremities: No lower extremity edema. No clubbing or deformities. Neuro: Alert and oriented x 4   Skin: Warm and dry, no jaundice.   Psych: Alert and cooperative, normal mood and affect.   Imaging Studies: Mm Digital Screening  02/28/2013   *RADIOLOGY REPORT*  Clinical Data: Screening.  DIGITAL BILATERAL SCREENING MAMMOGRAM WITH CAD  Comparison:  Previous exams.  FINDINGS:  ACR Breast Density Category 3: The breast tissue is heterogeneously dense.  No suspicious masses, architectural distortion, or calcifications are present.  Images were processed with CAD.  IMPRESSION: No mammographic evidence of malignancy.  A result letter of this screening mammogram will be mailed directly to the patient.  RECOMMENDATION: Screening mammogram in one year. (Code:SM-B-01Y)  BI-RADS CATEGORY 1:  Negative.   Original Report Authenticated By: Rolla Plate, M.D.

## 2013-03-29 NOTE — Patient Instructions (Signed)
1. Continue AcipHex 20 mg twice daily. 2. Please call if you have ongoing symptoms and would like to pursue CT of the abdomen.

## 2013-03-29 NOTE — Assessment & Plan Note (Signed)
Typical GERD symptoms well-controlled on Aciphex 20mg  BID. If one dose missed, she has some recurrent symptoms. She is concerned about sensation of pressure under her xiphoid process when she tries to take a deep breath. She reports her PCP questioned if it was related to her hiatal hernia. It is notable that she has a small hiatal hernia, this not even identified at time of her barium esophagram last fall. I explained to the patient this is likely not a problem for her. Unlikely to cause significant symptoms. Suggest that she make sure she has a current/up-to-date chest x-ray. She has a lot of asthma/allergy issues and plans to see Dr. Maple Hudson in the near future. I did offer her an abdominal CT to further evaluate the upper abdomen for any sort of abnormality that would cause sensation of fullness or compression in this area. At this time she is not interested but will let me know if she changes her mind. For now she'll continue AcipHex 20 mg twice daily.

## 2013-03-30 ENCOUNTER — Ambulatory Visit (INDEPENDENT_AMBULATORY_CARE_PROVIDER_SITE_OTHER): Payer: Managed Care, Other (non HMO) | Admitting: Otolaryngology

## 2013-03-30 DIAGNOSIS — J310 Chronic rhinitis: Secondary | ICD-10-CM

## 2013-03-30 DIAGNOSIS — H6120 Impacted cerumen, unspecified ear: Secondary | ICD-10-CM

## 2013-03-30 DIAGNOSIS — J343 Hypertrophy of nasal turbinates: Secondary | ICD-10-CM

## 2013-03-31 ENCOUNTER — Other Ambulatory Visit (HOSPITAL_COMMUNITY): Payer: Self-pay | Admitting: Physician Assistant

## 2013-03-31 ENCOUNTER — Ambulatory Visit (HOSPITAL_COMMUNITY)
Admission: RE | Admit: 2013-03-31 | Discharge: 2013-03-31 | Disposition: A | Discharge status: Home / Self Care | Payer: Managed Care, Other (non HMO) | Source: Ambulatory Visit | Attending: Physician Assistant | Admitting: Physician Assistant

## 2013-03-31 DIAGNOSIS — M79642 Pain in left hand: Secondary | ICD-10-CM

## 2013-03-31 DIAGNOSIS — M79609 Pain in unspecified limb: Secondary | ICD-10-CM | POA: Insufficient documentation

## 2013-04-19 ENCOUNTER — Ambulatory Visit: Payer: Managed Care, Other (non HMO) | Admitting: Internal Medicine

## 2013-05-17 ENCOUNTER — Ambulatory Visit (INDEPENDENT_AMBULATORY_CARE_PROVIDER_SITE_OTHER): Payer: Managed Care, Other (non HMO) | Admitting: Internal Medicine

## 2013-05-17 ENCOUNTER — Encounter: Payer: Self-pay | Admitting: Internal Medicine

## 2013-05-17 VITALS — BP 120/78 | HR 92 | Ht 61.0 in | Wt 123.0 lb

## 2013-05-17 DIAGNOSIS — J309 Allergic rhinitis, unspecified: Secondary | ICD-10-CM

## 2013-05-17 DIAGNOSIS — J3089 Other allergic rhinitis: Secondary | ICD-10-CM

## 2013-05-17 DIAGNOSIS — J45909 Unspecified asthma, uncomplicated: Secondary | ICD-10-CM

## 2013-05-17 DIAGNOSIS — J45998 Other asthma: Secondary | ICD-10-CM

## 2013-05-17 NOTE — Progress Notes (Signed)
10/14/11- 40 yoF never smoker followed for allergic rhinitis, rhinosinusitis.  PCP Lubertha South LOV- 06/19/10 She comes alone this visit but has many small children and is considering having another. Has had flu vaccine. Did well through the summer. With onset of early fall weather, began noting more of thick mucus. Frequent colds cycle through her family. She doesn't know when her mild sense of head and chest congestion may be from a viral infection. She now has a nebulizer machine with albuterol. This helps "some, a little". Sticky sensation doesn't go away. Can occasionally cough up a little white phlegm. Mucinex helps. We discussed the importance of hydration as the indoor heat comes on and she admits she probably does not drink enough fluid.  06/09/12- 40 yoF never smoker followed for allergic rhinitis, rhinosinusitis. Asthma       PCP Lubertha South Pt states having some chest congestion,productive cough,sob,denies any wheezing. Pt states still  taking allergy injections at home . She comes today with 4 small children. She feels as if she is "breathing through a straw" at times but never notices wheezing. A few colds. Occasional cough. Throat feels "sticky". She uses rescue inhaler before exercise. Recognizes no problems with her allergy vaccine.  05/17/13- 40 yoF never smoker followed for allergic rhinitis, rhinosinusitis. Asthma, complicated by hypothyroid, hiatal hernia     PCP Lubertha South No antihistamines, OTC cough syrups, or OTC sleep aids in past 3 days. Has been on allergy vaccine 1:10. Concerned that tight feeling in throat with much postnasal drip this spring, increased nasal congestion all may indicate need to retest. Allergy skin testing 05/17/13- significant positives especially for wheeze and tree pollens, cat. This is a significant pattern change since her original testing.  ROS-see HPI Constitutional:   No-   weight loss, night sweats, fevers, chills, fatigue, lassitude. HEENT:   No-   headaches, difficulty swallowing, tooth/dental problems, sore throat,       No-  sneezing, itching, ear ache, +nasal congestion, +post nasal drip,  CV:  No-   chest pain, orthopnea, PND, swelling in lower extremities, anasarca, dizziness, palpitations Resp: + tightness/ shortness of breath with exertion or at rest.              No-   productive cough,  No non-productive cough,  No- coughing up of blood.              No-   change in color of mucus.  No- wheezing.   Skin: No-   rash or lesions. GI:  No-   heartburn, indigestion, abdominal pain, nausea, vomiting,  GU:  MS:  No-   joint pain or swelling.   Neuro-     nothing unusual Psych:  No- change in mood or affect. No depression or anxiety.  No memory loss.  OBJ General- Alert, Oriented, Affect-appropriate, Distress- none acute, muscular Skin- rash-none, lesions- none, excoriation- none Lymphadenopathy- none Head- atraumatic            Eyes- Gross vision intact, PERRLA, conjunctivae clear secretions            Ears- Hearing, canals-normal            Nose- Clear, no-Septal dev, mucus, polyps, erosion, perforation             Throat- Mallampati II , +mucosa pale , drainage- none, tonsils- atrophic.  Neck- flexible , trachea midline, no stridor , thyroid nl, carotid no bruit Chest - symmetrical excursion , unlabored  Heart/CV- RRR , 1/6 systolicmurmur , no gallop  , no rub, nl s1 s2                           - JVD- none , edema- none, stasis changes- none, varices- none           Lung- clear to P&A, wheeze- none, cough- none , dullness-none, rub- none           Chest wall-  Abd-  Br/ Gen/ Rectal- Not done, not indicated Extrem- cyanosis- none, clubbing, none, atrophy- none, strength- nl Neuro- grossly intact to observation

## 2013-05-17 NOTE — Patient Instructions (Addendum)
Sample Aerospan steroid inhaler    Try 2 puffs and rinse mouth, twice daily. Compare with Qvar  We are stopping your allergy vaccine now to see how you do. You can stop immediately or wait to use up the last of your existing vaccine

## 2013-05-23 ENCOUNTER — Telehealth: Payer: Self-pay | Admitting: Internal Medicine

## 2013-05-23 MED ORDER — AZITHROMYCIN 250 MG PO TABS: ORAL_TABLET | ORAL | Status: DC

## 2013-05-23 NOTE — Telephone Encounter (Signed)
Spoke with patient-- Patient feels like she has infection in throat Increased throat clearing w dark yellow to green mucus Denies any f/c/s Requesting an antibiotic be sent to pharmacy Dr. Maple Hudson please advise thank you [name]  Last oV 7 2 14  Next OV 7 2 15   Allergies  Allergen Reactions  . Sulfonamide Derivatives Anaphylaxis

## 2013-05-23 NOTE — Telephone Encounter (Signed)
Per CY--  Ok to call in zpak for the pt .  i have called and spoke with pt and she is aware of CY recs and that the rx for the zpak has been called to her pharmacy.  Pt voiced her understanding and nothing further is needed.

## 2013-05-30 ENCOUNTER — Telehealth: Payer: Self-pay | Admitting: Internal Medicine

## 2013-05-30 NOTE — Telephone Encounter (Signed)
Per CY--  Call in augmentin 875 mg  #20  1 po bid Use mucinex d

## 2013-05-30 NOTE — Telephone Encounter (Signed)
LMOM x 1 

## 2013-05-30 NOTE — Telephone Encounter (Signed)
Pt states that ZPAK did not give any relief to symptoms.  Pt c/o increased HA, sinus a pressure, low grade fever and yellow thick mucus--no improvement.   Allergies  Allergen Reactions  . Sulfonamide Derivatives Anaphylaxis   [facility] [address]  Please advise Dr Maple Hudson of any recs. Thanks

## 2013-05-30 NOTE — Telephone Encounter (Signed)
LMTCB

## 2013-05-31 MED ORDER — AMOXICILLIN-POT CLAVULANATE 875-125 MG PO TABS: 1.0000 | ORAL_TABLET | Freq: Two times a day (BID) | ORAL | Status: DC

## 2013-05-31 NOTE — Telephone Encounter (Signed)
rx sent and pt is aware. Jennifer Castillo, CMA  

## 2013-06-03 ENCOUNTER — Encounter: Payer: Self-pay | Admitting: Internal Medicine

## 2013-06-03 NOTE — Assessment & Plan Note (Signed)
Allergy skin testing 05/17/13- significant positives especially for wheeze and tree pollens, cat. We're stopping allergy vaccine now for observation. Plan-sample Aerospan steroid inhaler to compare with Qvar. Educated on distinction between airway symptoms and reflux.

## 2013-06-03 NOTE — Assessment & Plan Note (Addendum)
Allergy skin testing 05/17/13- significant positives especially for wheeze and tree pollens, cat. Plan- stop allergy vaccine for observation. Educated to distinguish throat complaints from airway tightness and postnasal drip versus reflux .

## 2013-06-13 ENCOUNTER — Telehealth: Payer: Self-pay | Admitting: Internal Medicine

## 2013-06-13 MED ORDER — FLUCONAZOLE 150 MG PO TABS: 150.0000 mg | ORAL_TABLET | Freq: Every day | ORAL | Status: DC

## 2013-06-13 NOTE — Telephone Encounter (Signed)
Per CY-Diflucan 150 mg #2 take 1 po QD x 2 days no refills.

## 2013-06-13 NOTE — Telephone Encounter (Signed)
ATC patient, no answer LMOMTCB 

## 2013-06-13 NOTE — Telephone Encounter (Signed)
Returning call.

## 2013-06-13 NOTE — Telephone Encounter (Signed)
Rx has been sent in. Pt is aware. 

## 2013-06-13 NOTE — Telephone Encounter (Signed)
Called, spoke with pt.   Augmentin rx was sent on 06/30/13 per CDY's instructions. Pt finished this x 3 days ago.  Reports itching and white discharge in vaginal area and white/red patches in the back of throat.  Throat also sore and dry.  Requesting diflucan rx. Dr. Maple Hudson, pls advise.  Thank you.  [facility] in [place]  Allergies  Allergen Reactions  . Sulfonamide Derivatives Anaphylaxis

## 2013-06-14 ENCOUNTER — Encounter: Payer: Self-pay | Admitting: Internal Medicine

## 2013-08-01 ENCOUNTER — Other Ambulatory Visit: Payer: Self-pay | Admitting: Obstetrics & Gynecology

## 2013-08-01 DIAGNOSIS — N63 Unspecified lump in unspecified breast: Secondary | ICD-10-CM

## 2013-08-10 ENCOUNTER — Other Ambulatory Visit: Payer: Managed Care, Other (non HMO)

## 2013-08-11 ENCOUNTER — Telehealth: Payer: Self-pay | Admitting: Internal Medicine

## 2013-08-11 MED ORDER — PREDNISONE 10 MG PO TABS: ORAL_TABLET | ORAL | Status: DC

## 2013-08-11 NOTE — Telephone Encounter (Signed)
Pt. Is having chest and head congestion,drainage,chest tightness,can't take a deep breath.

## 2013-08-11 NOTE — Telephone Encounter (Signed)
LMOMTCB X1 FOR PT RX [facility]ENT TO [facility] Will forward message to tammy scott regarding vaccine

## 2013-08-11 NOTE — Telephone Encounter (Signed)
I spoke with pt and is aware of recs. Will forward back to tammy

## 2013-08-11 NOTE — Telephone Encounter (Signed)
Continued. You retested [name] [name] 05/17/13 and decided to stop all.vac. For observation. She had  significant positives( especially weeds,tree pollens and cat). Pt. Is suffering pretty bad. She's having chest & head congestion,drainage,chest tightness and she's not able to take a deep breath. We last sent her vac. 08/19/12 (1:10). Do you want her to start back if so what strength and dose?

## 2013-08-11 NOTE — Telephone Encounter (Signed)
Per CY- we can offer short term trial prednisone 10 mg #13  Take 2 daily x 3 days,then 1 daily x 7 days no refills and ok to restart her vaccine @ 1:50 0.1 with instructions to build to 0.5/vial/week.

## 2013-08-15 NOTE — Telephone Encounter (Signed)
I called pt. This morning and left her a message Dr.Young is going to restart her vac. @ 1:50 0.1 building up to 0.5. I will make this up today and get it in the mail.

## 2013-08-16 ENCOUNTER — Ambulatory Visit (INDEPENDENT_AMBULATORY_CARE_PROVIDER_SITE_OTHER): Payer: Managed Care, Other (non HMO)

## 2013-08-16 DIAGNOSIS — J309 Allergic rhinitis, unspecified: Secondary | ICD-10-CM

## 2013-08-22 ENCOUNTER — Telehealth: Payer: Self-pay | Admitting: Internal Medicine

## 2013-08-22 MED ORDER — PREDNISONE 10 MG PO TABS: ORAL_TABLET | ORAL | Status: DC

## 2013-08-22 NOTE — Telephone Encounter (Signed)
I spoke with pt and is aware of recs. Nothing further needed and rx sent

## 2013-08-22 NOTE — Telephone Encounter (Signed)
Called, spoke with pt.  She completed a pred taper x 3 days ago.  Reports she got a little better but is now starting to having a lot of mucus, coughing with a small amount of white, yellow, or green mucus, has difficulty getting a deep breath, and chest tightness.  She is using mucinex 1200 mg bid and albuterol neb with some relief.  I offered OV but pt cannot come in today.  Requesting Dr. Roxy Cedar recs.  Please advise.  Thank you.  Last OV with CY: 05/17/13  [facility] in [place]  Allergies  Allergen Reactions  . Sulfonamide Derivatives Anaphylaxis

## 2013-08-22 NOTE — Telephone Encounter (Signed)
Per CY-give Prednisone 10 mg #10 1 po QD x 10 days then stop no refills

## 2013-08-25 ENCOUNTER — Ambulatory Visit
Admission: RE | Admit: 2013-08-25 | Discharge: 2013-08-25 | Disposition: A | Discharge status: Home / Self Care | Payer: Managed Care, Other (non HMO) | Source: Ambulatory Visit | Attending: Obstetrics & Gynecology | Admitting: Obstetrics & Gynecology

## 2013-08-25 DIAGNOSIS — N63 Unspecified lump in unspecified breast: Secondary | ICD-10-CM

## 2013-08-29 ENCOUNTER — Telehealth: Payer: Self-pay | Admitting: General Practice

## 2013-08-29 NOTE — Telephone Encounter (Signed)
Patient said Verlon Au prescribed her Aciphex about 6 months ago and it is working well.  She wanted to know if she could switch to a least expensive one that is over the counter like Nexium.  She is still having a lot of belching.  Please advise?

## 2013-08-30 NOTE — Telephone Encounter (Signed)
We can try generic Aciphex or she can try OTC Nexium/Prevacid/Zegerid/Omeprazole.  How many Aciphex is she taking each day?

## 2013-08-30 NOTE — Telephone Encounter (Signed)
Pt has been taking Aciphex bid. She is going to try the Nexium OTC and she will let us know how it does.

## 2013-08-30 NOTE — Telephone Encounter (Signed)
Noted. She may require Nexium OTC BID (it is only 20mg  as opposed to 40mg  RX strength).

## 2013-08-30 NOTE — Telephone Encounter (Signed)
Called and left this message on VM.

## 2013-09-18 ENCOUNTER — Telehealth: Payer: Self-pay | Admitting: Internal Medicine

## 2013-09-18 MED ORDER — EPINEPHRINE 0.3 MG/0.3ML IJ SOAJ: 0.3000 mg | Freq: Once | INTRAMUSCULAR | Status: DC

## 2013-09-18 MED ORDER — ALBUTEROL SULFATE (2.5 MG/3ML) 0.083% IN NEBU: 2.5000 mg | INHALATION_SOLUTION | Freq: Four times a day (QID) | RESPIRATORY_TRACT | Status: DC | PRN

## 2013-09-18 MED ORDER — "TUBERCULIN SYRINGE 25G X 5/8"" 1 ML MISC": Status: DC

## 2013-09-18 NOTE — Telephone Encounter (Signed)
Ok to change her albuterol 0.083 nebs to give # 120, 1 neb 4 x daily as needed, refill x 5

## 2013-09-18 NOTE — Telephone Encounter (Signed)
Pt is requesting a larger quantity for her albuterol neb medication. Last rx was sent for 75ml and the pt states this did not last long enough and wants in increase in quantity. Please advise if ok.   Pt also requesting refill on epi pen and allergy needles. I will send all refills in at one time.

## 2013-09-18 NOTE — Telephone Encounter (Signed)
lmomtcb x1 

## 2013-09-18 NOTE — Telephone Encounter (Signed)
Refills sent and pt is aware. Jennifer Castillo, CMA  

## 2013-09-18 NOTE — Telephone Encounter (Signed)
Returning call can be reached at [phone].[name] [name]

## 2013-09-21 ENCOUNTER — Other Ambulatory Visit: Payer: Self-pay

## 2013-11-15 ENCOUNTER — Other Ambulatory Visit: Payer: Self-pay | Admitting: Gastroenterology

## 2013-11-17 ENCOUNTER — Other Ambulatory Visit: Payer: Self-pay

## 2013-11-17 MED ORDER — RABEPRAZOLE SODIUM 20 MG PO TBEC: DELAYED_RELEASE_TABLET | ORAL | Status: DC

## 2013-11-20 ENCOUNTER — Other Ambulatory Visit: Payer: Self-pay

## 2013-11-20 MED ORDER — RABEPRAZOLE SODIUM 20 MG PO TBEC: DELAYED_RELEASE_TABLET | ORAL | Status: DC

## 2013-12-05 ENCOUNTER — Encounter (HOSPITAL_COMMUNITY): Payer: Managed Care, Other (non HMO) | Attending: Hematology and Oncology

## 2013-12-05 ENCOUNTER — Encounter (HOSPITAL_COMMUNITY): Payer: Self-pay

## 2013-12-05 ENCOUNTER — Encounter (HOSPITAL_COMMUNITY): Payer: Managed Care, Other (non HMO)

## 2013-12-05 VITALS — BP 136/65 | HR 95 | Temp 97.4°F | Resp 16 | Wt 118.1 lb

## 2013-12-05 DIAGNOSIS — K219 Gastro-esophageal reflux disease without esophagitis: Secondary | ICD-10-CM

## 2013-12-05 DIAGNOSIS — D509 Iron deficiency anemia, unspecified: Secondary | ICD-10-CM

## 2013-12-05 DIAGNOSIS — K635 Polyp of colon: Secondary | ICD-10-CM

## 2013-12-05 LAB — COMPREHENSIVE METABOLIC PANEL
ALT: 25 U/L (ref 0–35)
AST: 30 U/L (ref 0–37)
Albumin: 4.2 g/dL (ref 3.5–5.2)
Alkaline Phosphatase: 67 U/L (ref 39–117)
BUN: 26 mg/dL — ABNORMAL HIGH (ref 6–23)
CALCIUM: 9.5 mg/dL (ref 8.4–10.5)
CO2: 26 meq/L (ref 19–32)
Chloride: 100 mEq/L (ref 96–112)
Creatinine, Ser: 0.77 mg/dL (ref 0.50–1.10)
Glucose, Bld: 88 mg/dL (ref 70–99)
Potassium: 3.6 mEq/L — ABNORMAL LOW (ref 3.7–5.3)
SODIUM: 140 meq/L (ref 137–147)
Total Bilirubin: 0.2 mg/dL — ABNORMAL LOW (ref 0.3–1.2)
Total Protein: 8.7 g/dL — ABNORMAL HIGH (ref 6.0–8.3)

## 2013-12-05 LAB — CBC WITH DIFFERENTIAL/PLATELET
BASOS ABS: 0.1 10*3/uL (ref 0.0–0.1)
Basophils Relative: 1 % (ref 0–1)
Eosinophils Absolute: 0.2 10*3/uL (ref 0.0–0.7)
Eosinophils Relative: 2 % (ref 0–5)
HCT: 34.5 % — ABNORMAL LOW (ref 36.0–46.0)
Hemoglobin: 11.3 g/dL — ABNORMAL LOW (ref 12.0–15.0)
LYMPHS PCT: 30 % (ref 12–46)
Lymphs Abs: 2.1 10*3/uL (ref 0.7–4.0)
MCH: 28.5 pg (ref 26.0–34.0)
MCHC: 32.8 g/dL (ref 30.0–36.0)
MCV: 86.9 fL (ref 78.0–100.0)
Monocytes Absolute: 0.5 10*3/uL (ref 0.1–1.0)
Monocytes Relative: 7 % (ref 3–12)
NEUTROS ABS: 4.1 10*3/uL (ref 1.7–7.7)
Neutrophils Relative %: 59 % (ref 43–77)
PLATELETS: 420 10*3/uL — AB (ref 150–400)
RBC: 3.97 MIL/uL (ref 3.87–5.11)
RDW: 15.8 % — AB (ref 11.5–15.5)
WBC: 7 10*3/uL (ref 4.0–10.5)

## 2013-12-05 LAB — RETICULOCYTES
RBC.: 3.97 MIL/uL (ref 3.87–5.11)
RETIC COUNT ABSOLUTE: 51.6 10*3/uL (ref 19.0–186.0)
RETIC CT PCT: 1.3 % (ref 0.4–3.1)

## 2013-12-05 LAB — IRON AND TIBC
Iron: 139 ug/dL — ABNORMAL HIGH (ref 42–135)
Saturation Ratios: 30 % (ref 20–55)
TIBC: 464 ug/dL (ref 250–470)
UIBC: 325 ug/dL (ref 125–400)

## 2013-12-05 NOTE — Patient Instructions (Signed)
RECOMMENDATIONS MADE BY THE CONSULTANT AND ANY TEST RESULTS WILL BE SENT TO YOUR REFERRING PHYSICIAN.  EXAM FINDINGS BY THE PHYSICIAN TODAY AND SIGNS OR SYMPTOMS TO REPORT TO CLINIC OR PRIMARY PHYSICIAN:   We are going to set you up to receive Feraheme next week. This will take approximately 30-45 minutes total. Infusion time is 10-15 minutes but we will keep you for a few minutes to make sure that you do not have an allergic reaction to it. You will come to this floor (4th floor) to receive the Feraheme.   We want you to return in 6 weeks for labs (CBC & ferritin) and to see MD    Thank you for choosing [facility] to provide your oncology and hematology care.  To afford each patient quality time with our providers, please arrive at least 15 minutes before your scheduled appointment time.  With your help, our goal is to use those 15 minutes to complete the necessary work-up to ensure our physicians have the information they need to help with your evaluation and healthcare recommendations.    Effective January 1st, 2014, we ask that you re-schedule your appointment with our physicians should you arrive 10 or more minutes late for your appointment.  We strive to give you quality time with our providers, and arriving late affects you and other patients whose appointments are after yours.    Again, thank you for choosing [facility].  Our hope is that these requests will decrease the amount of time that you wait before being seen by our physicians.       _____________________________________________________________  Should you have questions after your visit to [facility], please contact our office at [phone] between the hours of 8:30 a.m. and 5:00 p.m.  Voicemails left after 4:30 p.m. will not be returned until the following business day.  For prescription refill requests, have your pharmacy contact our office with your prescription refill request.     Ferumoxytol injection What is this medicine? FERUMOXYTOL is an iron complex. Iron is used to make healthy red blood cells, which carry oxygen and nutrients throughout the body. This medicine is used to treat iron deficiency anemia in people with chronic kidney disease. This medicine may be used for other purposes; ask your health care provider or pharmacist if you have questions. COMMON BRAND NAME(S): Feraheme  What should I tell my health care provider before I take this medicine? They need to know if you have any of these conditions: -anemia not caused by low iron levels -high levels of iron in the blood -magnetic resonance imaging (MRI) test scheduled -an unusual or allergic reaction to iron, other medicines, foods, dyes, or preservatives -pregnant or trying to get pregnant -breast-feeding How should I use this medicine? This medicine is for injection into a vein. It is given by a health care professional in a hospital or clinic setting. Talk to your pediatrician regarding the use of this medicine in children. Special care may be needed. Overdosage: If you think you've taken too much of this medicine contact a poison control center or emergency room at once. Overdosage: If you think you have taken too much of this medicine contact a poison control center or emergency room at once. NOTE: This medicine is only for you. Do not share this medicine with others. What if I miss a dose? It is important not to miss your dose. Call your doctor or health care professional if you are  unable to keep an appointment. What may interact with this medicine? This medicine may interact with the following medications: -other iron products This list may not describe all possible interactions. Give your health care provider a list of all the medicines, herbs, non-prescription drugs, or dietary supplements you use. Also tell them if you smoke, drink alcohol, or use illegal drugs. Some items may interact with  your medicine. What should I watch for while using this medicine? Visit your doctor or healthcare professional regularly. Tell your doctor or healthcare professional if your symptoms do not start to get better or if they get worse. You may need blood work done while you are taking this medicine. You may need to follow a special diet. Talk to your doctor. Foods that contain iron include: whole grains/cereals, dried fruits, beans, or peas, leafy green vegetables, and organ meats (liver, kidney). What side effects may I notice from receiving this medicine? Side effects that you should report to your doctor or health care professional as soon as possible: -allergic reactions like skin rash, itching or hives, swelling of the face, lips, or tongue -breathing problems -changes in blood pressure -feeling faint or lightheaded, falls -fever or chills -flushing, sweating, or hot feelings -swelling of the ankles or feet Side effects that usually do not require medical attention (Report these to your doctor or health care professional if they continue or are bothersome.): -diarrhea -headache -nausea, vomiting -stomach pain This list may not describe all possible side effects. Call your doctor for medical advice about side effects. You may report side effects to FDA at 1-800-FDA-1088. Where should I keep my medicine? This drug is given in a hospital or clinic and will not be stored at home. NOTE: This sheet is a summary. It may not cover all possible information. If you have questions about this medicine, talk to your doctor, pharmacist, or health care provider.  2014, Elsevier/Gold Standard. (2012-06-17 15:23:36)

## 2013-12-05 NOTE — Progress Notes (Signed)
[name] [name] presented for labwork. Labs per MD order drawn via Peripheral Line 23 gauge needle inserted in RT AC  Good blood return present. Procedure without incident.  Needle removed intact. Patient tolerated procedure well.

## 2013-12-05 NOTE — Progress Notes (Signed)
[place] A. Barnet Glasgow, M.D.  NEW PATIENT EVALUATION   Name: [name] [name] Date: 12/05/2013 MRN: [id number] DOB: 08-01-1971  PCP: Jana Half   REFERRING PHYSICIAN: Delman Cheadle, PA-C  REASON FOR REFERRAL: Iron deficiency.     HISTORY OF PRESENT ILLNESS:[name] [name] is a 43 y.o. female who is  Referred by her family physician because of iron deficiency. Since moving to [place] from [place] she has had terrible perennial rhinitis as has one of her daughters. She has intermittent cervical lymphadenopathy left greater than right. She's had chronic cough without expectoration, epistaxis, hemoptysis, hematuria, melena, or hematochezia. Measure. Last 4 days every month with day 4 of this month's occurring today. Appetite is good with no nausea, vomiting, fever, night sweats, easy satiety, diarrhea, constipation, dysuria, hematuria, incontinence, skin rash, headache, or seizures. She suffers with cheilitis but does not crave ice. She denies any dark urine. She's been more fatigued of late but over the past 2 weeks has improved since being started on oral iron supplements 4 to 6 weeks ago. She is not very tolerant of these agents orally.  PAST MEDICAL HISTORY:  has a past medical history of Chronic rhinosinusitis; Allergic rhinitis; Cough; Cardiac murmur; HSV infection; Mono exposure; Thrush, oral; Yeast infection of the vagina; and Mononucleosis (01/06/2012).   [facility] February/2013: December 2012 she was seen by her primary care physician who placed her on oral iron tablets, presumably ferrous sulfate 325 mg 3 times daily, for anemia. She took these tablets as ordered up until 12/18/2011 approximately one week before her scheduled colonoscopy. Since that time of stopping the iron pills, she has not been taking them. On 11/30/2011 the patient had laboratory work performed which reveals a white blood cell count of 6.1, hemoglobin  11.7, MCV 89.4, and platelet count of 383,000. She had a vitamin B12 level of 1763 and a folate level of greater than 20. He was noted that her ferritin was 26 at that time. Sedimentation rate 14. He was during this laboratory work that her mono screen was positive. During this time frame, the patient was on oral iron as noted above. The patient discontinued her oral iron on 12/18/2011 in preparation for upper and lower endoscopy. This was performed by Dr. Barney Drain on 12/25/2011. A colon, duodenal, and stomach biopsy was performed in all of which were negative. Since this study, the patient is not taking her oral iron preparation as noted above. In February of 2013 she was instructed to resume iron supplements.     PAST SURGICAL HISTORY: Past Surgical History  Procedure Laterality Date  . Cesarean section  2002,2004,2008, 2009  . Nasal sinus surgery  2005  . Colonoscopy  12/25/2011    Dr. Oneida Alar: small internal hemorrhoids, adenomas, surveillance in 2018  . Esophagogastroduodenoscopy  Feb 2013    Dr. Oneida Alar: normal stomach, duodenum, esophagus. Gastritis. hiatal hernia     CURRENT MEDICATIONS: has a current medication list which includes the following prescription(s): albuterol, albuterol, azelastine hcl, beclomethasone, cetirizine, epinephrine, levothyroxine, montelukast, multivitamin with minerals, tuberculin syr 1cc/25gx5/8", guaifenesin, and valacyclovir.   ALLERGIES: Sulfonamide derivatives   SOCIAL HISTORY:  reports that she has never smoked. She has never used smokeless tobacco. She reports that she does not drink alcohol or use illicit drugs.   FAMILY HISTORY: family history includes Allergies in her brother and sister; Breast cancer in an other family member; Cancer in her maternal grandmother and mother; Cervical  cancer in her mother; Colon polyps (age of onset: 57) in her mother. There is no history of Colon cancer.    REVIEW OF SYSTEMS:  Other than that discussed above  is noncontributory.    PHYSICAL EXAM:  vitals were not taken for this visit.   GENERAL:alert, no distress and comfortable SKIN: skin color, texture, turgor are normal, no rashes or significant lesions EYES: normal, Conjunctiva are pink and non-injected, sclera clear OROPHARYNX:no exudate, no erythema and lips, buccal mucosa, and tongue normal  NECK: supple, thyroid normal size, non-tender, without nodularity CHEST: Normal AP diameter with no breast masses. LYMPH:  no palpable lymphadenopathy in the cervical, axillary or inguinal LUNGS: clear to auscultation and percussion with normal breathing effort HEART: regular rate & rhythm and no murmurs ABDOMEN:abdomen soft, non-tender and normal bowel sounds MUSCULOSKELETALl:no cyanosis of digits, no clubbing or edema  NEURO: alert & oriented x 3 with fluent speech, no focal motor/sensory deficits    LABORATORY DATA:   11/06/2011: WBC 7.5, hemoglobin 10.9, platelets 404,000.  ANA negative  11/07/2013: WBC 7.9, hemoglobin 10.4, platelets 442,000 with MCV of 81.2.                      Ferritin 9, B12 level of 1336, TSH of 1.757  No visits with results within 30 Day(s) from this visit. Latest known visit with results is:  Office Visit on 01/06/2012  Component Date Value Range Status  . IgG (Immunoglobin G), Serum 01/06/2012 1390  690 - 1700 mg/dL Final  . IgA 01/06/2012 193  69 - 380 mg/dL Final  . IgM, Serum 01/06/2012 292* 52 - 322 mg/dL Final  . EBV VCA IgG 01/06/2012 4.59*  Final   Comment: (NOTE)                            ISR = Immune Status Ratio                                      <= 0.90 ISR             Negative                                      0.91-1.09 ISR           Equivocal                                      >= 1.10 ISR             Positive  . EBV VCA IgM 01/06/2012 0.87   Final   Comment: (NOTE)                            ISR = Immune Status Ratio                                      <= 0.90 ISR              Negative  0.91-1.09 ISR           Equivocal                                      >= 1.10 ISR             Positive  . CMV Ab - IgG 01/06/2012 0.44  <0.90 Final   Comment: (NOTE)                          Result             Interpretation                          -------------      ------------------------------------------                          <0.90              Negative for anti-CMV IgG antibody                          >=0.90 to <1.10    Equivocal                          >=1.10             Positive for anti-CMV IgG antibody                          A positive result indicates that the patient has antibodies to CMV.                          It does not differentiate between an active or past infection.                          The clinical diagnosis must be interpreted in conjunction with the                          clinical signs and symptoms of the patient.  . CMV IgM 01/06/2012 0.32  <0.90 Final   Comment: (NOTE)                          Result             Interpretation                          -------------      ------------------------------------------                          <0.90              Negative for anti-CMV IgM antibody                          >=0.90 to <1.10    Equivocal                          >=1.10  Positive for anti-CMV IgM antibody                          Results from any one IgM assay should not be used as a sole                          determinant of a current or recent infection. Because an IgM test can                          yield false positive results and low levels of IgM antibody may                          persist for more than 12 months post infection, reliance on a single                          test result could be misleading. If an acute infection is suspected,                          consider obtaining a new specimen and submit for both IgG and IgM                          testing in two or  more weeks.    Urinalysis No results found for this basename: colorurine, appearanceur, labspec, phurine, glucoseu, hgbur, bilirubinur, ketonesur, proteinur, urobilinogen, nitrite, leukocytesur      @RADIOGRAPHY : No results found.  PATHOLOGY: Peripheral smear reveals microcytic hypochromic cells with polychromasia.   IMPRESSION:  #1. 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 #2. History of gastroesophageal reflux disease, no longer on proton pump inhibitors. #3. Allergic rhinitis, severe. #4. History of colonic adenomas, status post colonoscopic resection.   PLAN:  #1. Intravenous Feraheme. #2. Followup in 6 weeks with CBC and ferritin.   Doroteo Bradford, MD 12/05/2013 2:22 PM

## 2013-12-06 LAB — ERYTHROPOIETIN: Erythropoietin: 19 m[IU]/mL — ABNORMAL HIGH (ref 2.6–18.5)

## 2013-12-06 LAB — INTRINSIC FACTOR ANTIBODIES: INTRINSIC FACTOR: NEGATIVE

## 2013-12-06 LAB — FERRITIN: Ferritin: 10 ng/mL (ref 10–291)

## 2013-12-12 ENCOUNTER — Ambulatory Visit (HOSPITAL_COMMUNITY): Payer: Managed Care, Other (non HMO)

## 2013-12-13 ENCOUNTER — Encounter (HOSPITAL_BASED_OUTPATIENT_CLINIC_OR_DEPARTMENT_OTHER): Payer: Managed Care, Other (non HMO)

## 2013-12-13 VITALS — BP 140/73 | HR 90 | Temp 98.3°F | Resp 20 | Wt 116.0 lb

## 2013-12-13 DIAGNOSIS — D126 Benign neoplasm of colon, unspecified: Secondary | ICD-10-CM

## 2013-12-13 DIAGNOSIS — K219 Gastro-esophageal reflux disease without esophagitis: Secondary | ICD-10-CM

## 2013-12-13 DIAGNOSIS — D649 Anemia, unspecified: Secondary | ICD-10-CM

## 2013-12-13 MED ORDER — SODIUM CHLORIDE 0.9 % IJ SOLN
10.0000 mL | INTRAMUSCULAR | Status: DC | PRN
  Administered 2013-12-13: 10 mL

## 2013-12-13 MED ORDER — SODIUM CHLORIDE 0.9 % IV SOLN: Freq: Once | INTRAVENOUS | Status: DC

## 2013-12-13 MED ORDER — FERUMOXYTOL INJECTION 510 MG/17 ML
1020.0000 mg | Freq: Once | INTRAVENOUS | Status: AC
  Administered 2013-12-13: 1020 mg via INTRAVENOUS
  Filled 2013-12-13: qty 34

## 2013-12-13 NOTE — Progress Notes (Signed)
Tolerated well. Advised of potential side effects, will call clinic for any questions or concerns.

## 2013-12-14 ENCOUNTER — Ambulatory Visit (HOSPITAL_COMMUNITY): Payer: Managed Care, Other (non HMO)

## 2013-12-14 ENCOUNTER — Ambulatory Visit (INDEPENDENT_AMBULATORY_CARE_PROVIDER_SITE_OTHER): Payer: Managed Care, Other (non HMO) | Admitting: Otolaryngology

## 2013-12-14 DIAGNOSIS — J310 Chronic rhinitis: Secondary | ICD-10-CM

## 2013-12-14 DIAGNOSIS — J343 Hypertrophy of nasal turbinates: Secondary | ICD-10-CM

## 2013-12-25 NOTE — Progress Notes (Signed)
REVIEWED.  NEEDS OPV IN 3-4 MOS WITH LL OR SLF E30 GERD,ANEMIA.

## 2014-01-10 ENCOUNTER — Other Ambulatory Visit (HOSPITAL_COMMUNITY): Payer: Managed Care, Other (non HMO)

## 2014-01-10 ENCOUNTER — Ambulatory Visit (HOSPITAL_COMMUNITY): Payer: Managed Care, Other (non HMO)

## 2014-01-11 ENCOUNTER — Encounter (HOSPITAL_COMMUNITY): Payer: Self-pay

## 2014-01-11 NOTE — Progress Notes (Signed)
This encounter was created in error - please disregard.

## 2014-01-15 ENCOUNTER — Other Ambulatory Visit (HOSPITAL_COMMUNITY): Payer: Self-pay | Admitting: Obstetrics & Gynecology

## 2014-01-15 DIAGNOSIS — R928 Other abnormal and inconclusive findings on diagnostic imaging of breast: Secondary | ICD-10-CM

## 2014-01-24 ENCOUNTER — Encounter (HOSPITAL_COMMUNITY): Payer: Managed Care, Other (non HMO) | Attending: Hematology and Oncology

## 2014-01-24 ENCOUNTER — Encounter (HOSPITAL_COMMUNITY): Payer: Managed Care, Other (non HMO)

## 2014-01-24 ENCOUNTER — Encounter (HOSPITAL_COMMUNITY): Payer: Self-pay

## 2014-01-24 VITALS — BP 139/81 | HR 96 | Temp 98.2°F | Resp 20 | Wt 124.0 lb

## 2014-01-24 DIAGNOSIS — J309 Allergic rhinitis, unspecified: Secondary | ICD-10-CM

## 2014-01-24 DIAGNOSIS — D509 Iron deficiency anemia, unspecified: Secondary | ICD-10-CM | POA: Insufficient documentation

## 2014-01-24 DIAGNOSIS — Z862 Personal history of diseases of the blood and blood-forming organs and certain disorders involving the immune mechanism: Secondary | ICD-10-CM

## 2014-01-24 DIAGNOSIS — K219 Gastro-esophageal reflux disease without esophagitis: Secondary | ICD-10-CM

## 2014-01-24 LAB — CBC WITH DIFFERENTIAL/PLATELET
BASOS ABS: 0 10*3/uL (ref 0.0–0.1)
Basophils Relative: 1 % (ref 0–1)
Eosinophils Absolute: 0 10*3/uL (ref 0.0–0.7)
Eosinophils Relative: 0 % (ref 0–5)
HCT: 37.2 % (ref 36.0–46.0)
Hemoglobin: 12 g/dL (ref 12.0–15.0)
LYMPHS PCT: 8 % — AB (ref 12–46)
Lymphs Abs: 0.6 10*3/uL — ABNORMAL LOW (ref 0.7–4.0)
MCH: 29.5 pg (ref 26.0–34.0)
MCHC: 32.3 g/dL (ref 30.0–36.0)
MCV: 91.4 fL (ref 78.0–100.0)
Monocytes Absolute: 0.1 10*3/uL (ref 0.1–1.0)
Monocytes Relative: 1 % — ABNORMAL LOW (ref 3–12)
NEUTROS ABS: 5.9 10*3/uL (ref 1.7–7.7)
NEUTROS PCT: 90 % — AB (ref 43–77)
PLATELETS: 366 10*3/uL (ref 150–400)
RBC: 4.07 MIL/uL (ref 3.87–5.11)
RDW: 17 % — AB (ref 11.5–15.5)
WBC: 6.6 10*3/uL (ref 4.0–10.5)

## 2014-01-24 NOTE — Patient Instructions (Addendum)
[facility] Discharge Instructions  RECOMMENDATIONS MADE BY THE CONSULTANT AND ANY TEST RESULTS WILL BE SENT TO YOUR REFERRING PHYSICIAN.  EXAM FINDINGS BY THE PHYSICIAN TODAY AND SIGNS OR SYMPTOMS TO REPORT TO CLINIC OR PRIMARY PHYSICIAN: Exam and findings as discussed by Dr. Barnet Glasgow.  Will let you know of any issues with your lab work.  Recommend that you use a Netty Pot to help clear your sinuses.  Report increased fatigue, shortness of breath, etc.  MEDICATIONS PRESCRIBED:  none  INSTRUCTIONS/FOLLOW-UP: Follow-up with labs and office visit in 1 year.  Thank you for choosing [facility] to provide your oncology and hematology care.  To afford each patient quality time with our providers, please arrive at least 15 minutes before your scheduled appointment time.  With your help, our goal is to use those 15 minutes to complete the necessary work-up to ensure our physicians have the information they need to help with your evaluation and healthcare recommendations.    Effective January 1st, 2014, we ask that you re-schedule your appointment with our physicians should you arrive 10 or more minutes late for your appointment.  We strive to give you quality time with our providers, and arriving late affects you and other patients whose appointments are after yours.    Again, thank you for choosing [facility].  Our hope is that these requests will decrease the amount of time that you wait before being seen by our physicians.       _____________________________________________________________  Should you have questions after your visit to [facility], please contact our office at [phone] between the hours of 8:30 a.m. and 5:00 p.m.  Voicemails left after 4:30 p.m. will not be returned until the following business day.  For prescription refill requests, have your pharmacy contact our office with your prescription refill request.

## 2014-01-24 NOTE — Progress Notes (Signed)
[place]  OFFICE PROGRESS NOTE  Delman Cheadle, PA-C [address] [place] [address]  DIAGNOSIS: Anemia, iron deficiency - Plan: omeprazole (PRILOSEC) 20 MG capsule, Ferritin, CBC with Differential, CBC with Differential, Comprehensive metabolic panel, Ferritin  Chief Complaint  Patient presents with  . Iron deficiency secondary to menstrual blood loss    CURRENT THERAPY: Intravenous Feraheme on 12/13/2013.  INTERVAL HISTORY: [name] [name] 43 y.o. female returns for followup after receiving intravenous Feraheme on 12/05/2013 for iron deficiency with a mild anemia. She tolerated intravenous iron well with no episodes of joint discomfort or muscle aches. Sinuses have flared up dramatically since the weather warmed. Appetite is good with no nausea or vomiting. She denies any heartburn. She denies a lower extremity swelling or redness but does have occasional paresthesias involving the left upper extremity. She is mouth breathing. She denies any eructation or flatulence. She denies a diarrhea, constipation, nausea, vomiting, but has had increasing cough without expectoration or hemoptysis.  MEDICAL HISTORY: Past Medical History  Diagnosis Date  . Chronic rhinosinusitis   . Allergic rhinitis   . Cough   . Cardiac murmur   . HSV infection     History of-outbreak during pregnancy 2004  . Mono exposure     Had in January  . Thrush, oral   . Yeast infection of the vagina   . Mononucleosis 01/06/2012    INTERIM HISTORY: has SINUSITIS, ACUTE; RHINOSINUSITIS, RECURRENT; Seasonal and perennial allergic rhinitis; CARDIAC MURMUR; COUGH; Thrush; Allergic-infective asthma; Dysphagia; GERD (gastroesophageal reflux disease); Anemia; Family history of colonic polyps; Mononucleosis; Colon adenomas; and Dysphagia on her problem list.   [place] February/2013: December 2012 she was seen by her primary care physician who placed her on oral iron  tablets, presumably ferrous sulfate 325 mg 3 times daily, for anemia. She took these tablets as ordered up until 12/18/2011 approximately one week before her scheduled colonoscopy. Since that time of stopping the iron pills, she has not been taking them. On 11/30/2011 the patient had laboratory work performed which reveals a white blood cell count of 6.1, hemoglobin 11.7, MCV 89.4, and platelet count of 383,000. She had a vitamin B12 level of 1763 and a folate level of greater than 20. He was noted that her ferritin was 26 at that time. Sedimentation rate 14. He was during this laboratory work that her mono screen was positive. During this time frame, the patient was on oral iron as noted above. The patient discontinued her oral iron on 12/18/2011 in preparation for upper and lower endoscopy. This was performed by Dr. Barney Drain on 12/25/2011. A colon, duodenal, and stomach biopsy was performed in all of which were negative. Since this study, the patient is not taking her oral iron preparation as noted above. In February of 2013 she was instructed to resume iron supplements  ALLERGIES:  is allergic to sulfonamide derivatives.  MEDICATIONS: has a current medication list which includes the following prescription(s): albuterol, albuterol, azelastine hcl, beclomethasone, cetirizine, ferrous fumarate, guaifenesin, levothyroxine, montelukast, multivitamin with minerals, omeprazole, PRESCRIPTION MEDICATION, tuberculin syr 1cc/25gx5/8", valacyclovir, and epinephrine.  SURGICAL HISTORY:  Past Surgical History  Procedure Laterality Date  . Cesarean section  2002,2004,2008, 2009  . Nasal sinus surgery  2005  . Colonoscopy  12/25/2011    Dr. Oneida Alar: small internal hemorrhoids, adenomas, surveillance in 2018  . Esophagogastroduodenoscopy  Feb 2013    Dr. Oneida Alar: normal stomach, duodenum, esophagus. Gastritis. hiatal hernia  FAMILY HISTORY: family history includes Allergies in her brother and sister; Breast  cancer in an other family member; Cancer in her maternal grandmother and mother; Cervical cancer in her mother; Colon polyps (age of onset: 50) in her mother. There is no history of Colon cancer.  SOCIAL HISTORY:  reports that she has never smoked. She has never used smokeless tobacco. She reports that she does not drink alcohol or use illicit drugs.  REVIEW OF SYSTEMS:  Other than that discussed above is noncontributory.  PHYSICAL EXAMINATION: ECOG PERFORMANCE STATUS: 1 - Symptomatic but completely ambulatory  Blood pressure 139/81, pulse 96, temperature 98.2 F (36.8 C), temperature source Oral, resp. rate 20, weight 124 lb (56.246 kg).  GENERAL:alert, no distress and comfortable SKIN: skin color, texture, turgor are normal, no rashes or significant lesions EYES: PERLA; Conjunctiva are pink and non-injected, sclera clear SINUSES: Boggy turbinates with no epistaxis. OROPHARYNX:no exudate, no erythema on lips, buccal mucosa, or tongue. NECK: supple, thyroid normal size, non-tender, without nodularity. No masses. No lymphadenopathy. CHEST: Normal AP diameter with no gynecomastia. LYMPH:  no palpable lymphadenopathy in the cervical, axillary or inguinal LUNGS: clear to auscultation and percussion with normal breathing effort HEART: regular rate & rhythm and no murmurs. ABDOMEN:abdomen soft, non-tender and normal bowel sounds MUSCULOSKELETAL:no cyanosis of digits and no clubbing. Range of motion normal.  NEURO: alert & oriented x 3 with fluent speech, no focal motor/sensory deficits   LABORATORY DATA: Office Visit on 01/24/2014  Component Date Value Ref Range Status  . WBC 01/24/2014 6.6  4.0 - 10.5 K/uL Final  . RBC 01/24/2014 4.07  3.87 - 5.11 MIL/uL Final  . Hemoglobin 01/24/2014 12.0  12.0 - 15.0 g/dL Final  . HCT 01/24/2014 37.2  36.0 - 46.0 % Final  . MCV 01/24/2014 91.4  78.0 - 100.0 fL Final  . MCH 01/24/2014 29.5  26.0 - 34.0 pg Final  . MCHC 01/24/2014 32.3  30.0 - 36.0  g/dL Final  . RDW 01/24/2014 17.0* 11.5 - 15.5 % Final  . Platelets 01/24/2014 366  150 - 400 K/uL Final  . Neutrophils Relative % 01/24/2014 90* 43 - 77 % Final  . Neutro Abs 01/24/2014 5.9  1.7 - 7.7 K/uL Final  . Lymphocytes Relative 01/24/2014 8* 12 - 46 % Final  . Lymphs Abs 01/24/2014 0.6* 0.7 - 4.0 K/uL Final  . Monocytes Relative 01/24/2014 1* 3 - 12 % Final  . Monocytes Absolute 01/24/2014 0.1  0.1 - 1.0 K/uL Final  . Eosinophils Relative 01/24/2014 0  0 - 5 % Final  . Eosinophils Absolute 01/24/2014 0.0  0.0 - 0.7 K/uL Final  . Basophils Relative 01/24/2014 1  0 - 1 % Final  . Basophils Absolute 01/24/2014 0.0  0.0 - 0.1 K/uL Final    PATHOLOGY: No new pathology.  Urinalysis No results found for this basename: colorurine,  appearanceur,  labspec,  phurine,  glucoseu,  hgbur,  bilirubinur,  ketonesur,  proteinur,  urobilinogen,  nitrite,  leukocytesur    RADIOGRAPHIC STUDIES: No results found.  ASSESSMENT:  #1. 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 #2. History of gastroesophageal reflux disease, on treatment. #3. Severe allergic rhinitis. #4. History of colonic adenomas, status post colonoscopic resection.   PLAN:  #1. Add Nettie pot to current inhaler and oral medication strategy for rhinitis. #2. Followup in one year with CBC, chem profile, and ferritin level.   All questions were answered. The patient knows to call the clinic with any problems, questions  or concerns. We can certainly see the patient much sooner if necessary.   I spent 25 minutes counseling the patient face to face. The total time spent in the appointment was 30 minutes.    Doroteo Bradford, MD 01/24/2014 4:16 PM

## 2014-01-25 LAB — FERRITIN: FERRITIN: 194 ng/mL (ref 10–291)

## 2014-02-05 ENCOUNTER — Telehealth: Payer: Self-pay | Admitting: Internal Medicine

## 2014-02-05 DIAGNOSIS — J45909 Unspecified asthma, uncomplicated: Secondary | ICD-10-CM

## 2014-02-05 NOTE — Telephone Encounter (Signed)
lmtcb x1 

## 2014-02-06 NOTE — Telephone Encounter (Signed)
Pt informed that order for nebulizer was sent to [facility]

## 2014-02-06 NOTE — Telephone Encounter (Signed)
Spoke with pt  - Current neb machine take ober 8 minutes to complete treatment.  Pt is on the go so much during the day and wanting to know if she could possibly get a rx for portable nebulizer or at least a new one that will be faster.  Please advise

## 2014-02-06 NOTE — Telephone Encounter (Signed)
Pt returned triage's call & can be reached at [phone].  [name] [name]

## 2014-02-06 NOTE — Telephone Encounter (Signed)
lmomtcb x1--where does she want this sent to?

## 2014-02-06 NOTE — Telephone Encounter (Signed)
Ok to Rx new compressor nebulizer for dx asthma, and comment for light, portable

## 2014-02-06 NOTE — Telephone Encounter (Signed)
Patient calling back stating she does not have a preference to where rx is sent.

## 2014-02-06 NOTE — Telephone Encounter (Signed)
LMOMTCB x 1 

## 2014-02-15 ENCOUNTER — Ambulatory Visit (INDEPENDENT_AMBULATORY_CARE_PROVIDER_SITE_OTHER): Payer: Managed Care, Other (non HMO) | Admitting: Otolaryngology

## 2014-02-28 ENCOUNTER — Encounter (HOSPITAL_COMMUNITY): Payer: Managed Care, Other (non HMO)

## 2014-03-08 ENCOUNTER — Ambulatory Visit (INDEPENDENT_AMBULATORY_CARE_PROVIDER_SITE_OTHER): Payer: Managed Care, Other (non HMO) | Admitting: Otolaryngology

## 2014-03-08 ENCOUNTER — Ambulatory Visit (INDEPENDENT_AMBULATORY_CARE_PROVIDER_SITE_OTHER): Payer: Managed Care, Other (non HMO)

## 2014-03-08 DIAGNOSIS — J309 Allergic rhinitis, unspecified: Secondary | ICD-10-CM

## 2014-03-21 ENCOUNTER — Ambulatory Visit (HOSPITAL_COMMUNITY)
Admission: RE | Admit: 2014-03-21 | Discharge: 2014-03-21 | Disposition: A | Discharge status: Home / Self Care | Payer: Managed Care, Other (non HMO) | Source: Ambulatory Visit | Attending: Obstetrics & Gynecology | Admitting: Obstetrics & Gynecology

## 2014-03-21 DIAGNOSIS — Z1231 Encounter for screening mammogram for malignant neoplasm of breast: Secondary | ICD-10-CM | POA: Insufficient documentation

## 2014-03-21 DIAGNOSIS — R928 Other abnormal and inconclusive findings on diagnostic imaging of breast: Secondary | ICD-10-CM | POA: Insufficient documentation

## 2014-03-21 DIAGNOSIS — R922 Inconclusive mammogram: Secondary | ICD-10-CM | POA: Insufficient documentation

## 2014-04-10 ENCOUNTER — Encounter (HOSPITAL_COMMUNITY): Payer: Self-pay | Admitting: Emergency Medicine

## 2014-04-10 ENCOUNTER — Emergency Department (HOSPITAL_COMMUNITY)
Admission: EM | Admit: 2014-04-10 | Discharge: 2014-04-10 | Disposition: A | Discharge status: Home / Self Care | Payer: Managed Care, Other (non HMO) | Attending: Emergency Medicine | Admitting: Emergency Medicine

## 2014-04-10 DIAGNOSIS — T169XXA Foreign body in ear, unspecified ear, initial encounter: Secondary | ICD-10-CM

## 2014-04-10 DIAGNOSIS — Y939 Activity, unspecified: Secondary | ICD-10-CM | POA: Insufficient documentation

## 2014-04-10 DIAGNOSIS — IMO0002 Reserved for concepts with insufficient information to code with codable children: Secondary | ICD-10-CM | POA: Insufficient documentation

## 2014-04-10 DIAGNOSIS — Z8619 Personal history of other infectious and parasitic diseases: Secondary | ICD-10-CM | POA: Insufficient documentation

## 2014-04-10 DIAGNOSIS — Z8742 Personal history of other diseases of the female genital tract: Secondary | ICD-10-CM | POA: Insufficient documentation

## 2014-04-10 DIAGNOSIS — Z8709 Personal history of other diseases of the respiratory system: Secondary | ICD-10-CM | POA: Insufficient documentation

## 2014-04-10 DIAGNOSIS — Y929 Unspecified place or not applicable: Secondary | ICD-10-CM | POA: Insufficient documentation

## 2014-04-10 DIAGNOSIS — R011 Cardiac murmur, unspecified: Secondary | ICD-10-CM | POA: Insufficient documentation

## 2014-04-10 DIAGNOSIS — Z79899 Other long term (current) drug therapy: Secondary | ICD-10-CM | POA: Insufficient documentation

## 2014-04-10 NOTE — ED Provider Notes (Signed)
CSN: [id number]     Arrival date & time 04/10/14  0128 History   First MD Initiated Contact with Patient 04/10/14 0134     Chief Complaint  Patient presents with  . Foreign Body in Ear     (Consider location/radiation/quality/duration/timing/severity/associated sxs/prior Treatment) HPI Comments: Patient is a 43 year old female who presents with complaints of a bug flowing into her ear.  Patient is a 43 y.o. female presenting with foreign body in ear. The history is provided by the patient.  Foreign Body in Ear This is a new problem. The current episode started less than 1 hour ago. The problem occurs constantly. The problem has not changed since onset.Nothing aggravates the symptoms. Nothing relieves the symptoms. She has tried nothing for the symptoms. The treatment provided no relief.    Past Medical History  Diagnosis Date  . Chronic rhinosinusitis   . Allergic rhinitis   . Cough   . Cardiac murmur   . HSV infection     History of-outbreak during pregnancy 2004  . Mono exposure     Had in January  . Thrush, oral   . Yeast infection of the vagina   . Mononucleosis 01/06/2012   Past Surgical History  Procedure Laterality Date  . Cesarean section  2002,2004,2008, 2009  . Nasal sinus surgery  2005  . Colonoscopy  12/25/2011    Dr. Oneida Alar: small internal hemorrhoids, adenomas, surveillance in 2018  . Esophagogastroduodenoscopy  Feb 2013    Dr. Oneida Alar: normal stomach, duodenum, esophagus. Gastritis. hiatal hernia   Family History  Problem Relation Age of Onset  . Allergies Sister   . Allergies Brother   . Cervical cancer Mother   . Colon polyps Mother 66    several "benign" polyps  . Cancer Mother   . Breast cancer      grandmother  . Colon cancer Neg Hx   . Cancer Maternal Grandmother    History  Substance Use Topics  . Smoking status: Never Smoker   . Smokeless tobacco: Never Used  . Alcohol Use: No   OB History   Grav Para Term Preterm Abortions TAB SAB Ect  Mult Living                 Review of Systems  All other systems reviewed and are negative.     Allergies  Sulfonamide derivatives  Home Medications   Prior to Admission medications   Medication Sig Start Date End Date Taking? Authorizing Provider  albuterol (PROVENTIL HFA) 108 (90 BASE) MCG/ACT inhaler Inhale 2 puffs into the lungs 4 (four) times daily as needed. For shortness of breath   Yes Historical Provider, MD  albuterol (PROVENTIL) (2.5 MG/3ML) 0.083% nebulizer solution Take 3 mLs (2.5 mg total) by nebulization 4 (four) times daily as needed. For shortness of breath 09/18/13  Yes Deneise Lever, MD  Azelastine HCl (ASTEPRO) 0.15 % SOLN Place 2 puffs into the nose at bedtime.     Yes Historical Provider, MD  beclomethasone (QVAR) 80 MCG/ACT inhaler Inhale 2 puffs into the lungs 2 (two) times daily.     Yes Historical Provider, MD  cetirizine (ZYRTEC) 10 MG tablet Take 10 mg by mouth daily.     Yes Historical Provider, MD  EPINEPHrine (EPI-PEN) 0.3 mg/0.3 mL SOAJ injection Inject 0.3 mLs (0.3 mg total) into the muscle once. 09/18/13  Yes Deneise Lever, MD  ferrous fumarate (HEMOCYTE - 106 MG FE) 325 (106 FE) MG TABS tablet Take 1 tablet by mouth  daily.   Yes Historical Provider, MD  guaiFENesin (MUCINEX) 600 MG 12 hr tablet Take 1,200 mg by mouth 2 (two) times daily.     Yes Historical Provider, MD  levothyroxine (SYNTHROID, LEVOTHROID) 25 MCG tablet Take 25 mcg by mouth daily before breakfast.  03/19/13  Yes Historical Provider, MD  montelukast (SINGULAIR) 10 MG tablet Take 10 mg by mouth at bedtime.     Yes Historical Provider, MD  Multiple Vitamin (MULITIVITAMIN WITH MINERALS) TABS Take 1 tablet by mouth daily.   Yes Historical Provider, MD  omeprazole (PRILOSEC) 20 MG capsule Take 20 mg by mouth 2 (two) times daily before a meal.   Yes Historical Provider, MD  PRESCRIPTION MEDICATION Low dose estrogen for birth control   Yes Historical Provider, MD  valACYclovir (VALTREX) 1000  MG tablet Take 1 tablet by mouth daily. 05/05/13  Yes Historical Provider, MD  TUBERCULIN SYR 1CC/25GX5/8" 25G X 5/8" 1 ML MISC Use to give allergy vaccine or other medications as directed 09/18/13   Deneise Lever, MD   BP 137/76  Pulse 99  Temp(Src) 97.6 F (36.4 C) (Oral)  Resp 18  Ht 5' (1.524 m)  Wt 122 lb (55.339 kg)  BMI 23.83 kg/m2  SpO2 100% Physical Exam  Nursing note and vitals reviewed. Constitutional: She is oriented to person, place, and time. She appears well-developed and well-nourished. No distress.  HENT:  Head: Normocephalic and atraumatic.  There is a small insect noted within the right ear canal. The TM appears normal.  Neck: Normal range of motion. Neck supple.  Neurological: She is alert and oriented to person, place, and time.  Skin: Skin is warm and dry. She is not diaphoretic.    ED Course  Procedures (including critical care time) Labs Review Labs Reviewed - No data to display  Imaging Review No results found.   EKG Interpretation None      MDM   Final diagnoses:  None    The ear was flushed with saline and a small insect was removed. Patient did have a brief dizzy spell, however is now feeling fine. The TM was examined after flushing and appears well. To return as needed for any problems.    Veryl Speak, MD 04/10/14 [phone]

## 2014-04-10 NOTE — Discharge Instructions (Signed)
Return to the emergency department if you develop any new or concerning symptoms.   Ear Foreign Body An ear foreign body is an object that is stuck in the ear. It is common for young children to put objects into the ear canal. These may include pebbles, beads, beans, and any other small objects which will fit. In adults, objects such as cotton swabs may become lodged in the ear canal. In all ages, the most common foreign bodies are insects that enter the ear canal.  SYMPTOMS  Foreign bodies may cause pain, buzzing or roaring sounds, hearing loss, and ear drainage.  HOME CARE INSTRUCTIONS   Keep all follow-up appointments with your caregiver as told.  Keep small objects out of reach of young children. Tell them not to put anything in their ears. SEEK IMMEDIATE MEDICAL CARE IF:   You have bleeding from the ear.  You have increased pain or swelling of the ear.  You have reduced hearing.  You have discharge coming from the ear.  You have a fever.  You have a headache. MAKE SURE YOU:   Understand these instructions.  Will watch your condition.  Will get help right away if you are not doing well or get worse. Document Released: 10/30/2000 Document Revised: 01/25/2012 Document Reviewed: 06/20/2008 [facility] Patient Information 2014 [place].

## 2014-04-10 NOTE — ED Notes (Signed)
Pt states a fly flew into her rt ear.

## 2014-05-17 ENCOUNTER — Ambulatory Visit (INDEPENDENT_AMBULATORY_CARE_PROVIDER_SITE_OTHER): Payer: Managed Care, Other (non HMO) | Admitting: Internal Medicine

## 2014-05-17 ENCOUNTER — Encounter: Payer: Self-pay | Admitting: Internal Medicine

## 2014-05-17 VITALS — BP 122/78 | HR 77 | Ht 61.0 in | Wt 122.0 lb

## 2014-05-17 DIAGNOSIS — J3089 Other allergic rhinitis: Principal | ICD-10-CM

## 2014-05-17 DIAGNOSIS — J45909 Unspecified asthma, uncomplicated: Secondary | ICD-10-CM

## 2014-05-17 DIAGNOSIS — J309 Allergic rhinitis, unspecified: Secondary | ICD-10-CM

## 2014-05-17 DIAGNOSIS — J302 Other seasonal allergic rhinitis: Secondary | ICD-10-CM

## 2014-05-17 DIAGNOSIS — J45998 Other asthma: Secondary | ICD-10-CM

## 2014-05-17 NOTE — Patient Instructions (Signed)
Consider trying to gargle with an otc mouthwash or salt water  We discussed continuing allergy shots through September. If we can't tell that it has helped, then maybe we should stop then.  Please call as needed  Ask Dr Benjamine Mola about the throat discomfort as well

## 2014-05-17 NOTE — Progress Notes (Signed)
10/14/11- 22 yoF never smoker followed for allergic rhinitis, rhinosinusitis.  PCP Mickie Hillier LOV- 06/19/10 She comes alone this visit but has many small children and is considering having another. Has had flu vaccine. Did well through the summer. With onset of early fall weather, began noting more of thick mucus. Frequent colds cycle through her family. She doesn't know when her mild sense of head and chest congestion may be from a viral infection. She now has a nebulizer machine with albuterol. This helps "some, a little". Sticky sensation doesn't go away. Can occasionally cough up a little white phlegm. Mucinex helps. We discussed the importance of hydration as the indoor heat comes on and she admits she probably does not drink enough fluid.  06/09/12- 50 yoF never smoker followed for allergic rhinitis, rhinosinusitis. Asthma       PCP Mickie Hillier Pt states having some chest congestion,productive cough,sob,denies any wheezing. Pt states still  taking allergy injections at home . She comes today with 4 small children. She feels as if she is "breathing through a straw" at times but never notices wheezing. A few colds. Occasional cough. Throat feels "sticky". She uses rescue inhaler before exercise. Recognizes no problems with her allergy vaccine.  05/17/13- 31 yoF never smoker followed for allergic rhinitis, rhinosinusitis. Asthma, complicated by hypothyroid, hiatal hernia     PCP Mickie Hillier No antihistamines, OTC cough syrups, or OTC sleep aids in past 3 days. Has been on allergy vaccine 1:10. Concerned that tight feeling in throat with much postnasal drip this spring, increased nasal congestion all may indicate need to retest. Allergy skin testing 05/17/13- significant positives especially for wheeze and tree pollens, cat. This is a significant pattern change since her original testing.  05/17/14- 39 yoF never smoker followed for allergic rhinitis, rhinosinusitis. Asthma, complicated by hypothyroid,  hiatal hernia     PCP [place]: Had sinus surgery 01-2014/ Dr Benjamine Mola; throat feels puffy since May 2015-comes and goes Back on Allergy vaccine1:10 GO for 2-3 months. She has a second dog now, and several children as potential sources of virus infections Recently finished amoxicillin for ? Sinusitis, without effect Says acid blocker does not affect heartburn/globus sensation. Complains again of a "stickiness" sensation in her airways.  ROS-see HPI Constitutional:   No-   weight loss, night sweats, fevers, chills, fatigue, lassitude. HEENT:   No-  headaches, difficulty swallowing, tooth/dental problems, sore throat,       No-  sneezing, itching, ear ache, +nasal congestion, +post nasal drip,  CV:  No-   chest pain, orthopnea, PND, swelling in lower extremities, anasarca, dizziness, palpitations Resp: + tightness/ shortness of breath with exertion or at rest.              No-   productive cough,  No non-productive cough,  No- coughing up of blood.              No-   change in color of mucus.  No- wheezing.   Skin: No-   rash or lesions. GI:  No-   heartburn, indigestion, abdominal pain, nausea, vomiting,  GU:  MS:  No-   joint pain or swelling.   Neuro-     nothing unusual Psych:  No- change in mood or affect. No depression or anxiety.  No memory loss.  OBJ General- Alert, Oriented, Affect-appropriate, Distress- none acute, muscular Skin- rash-none, lesions- none, excoriation- none Lymphadenopathy- none Head- atraumatic  Eyes- Gross vision intact, PERRLA, conjunctivae clear secretions            Ears- Hearing, canals-normal            Nose- Clear, no-Septal dev, mucus, polyps, erosion, perforation             Throat- Mallampati II , +mucosa pale , drainage- none, tonsils- atrophic, voice normal Neck- flexible , trachea midline, no stridor , thyroid nl, carotid no bruit Chest - symmetrical excursion , unlabored           Heart/CV- RRR , 1/6 systolicmurmur , no  gallop  , no rub, nl s1 s2                           - JVD- none , edema- none, stasis changes- none, varices- none           Lung- clear to P&A, wheeze- none, cough- none , dullness-none, rub- none           Chest wall-  Abd-  Br/ Gen/ Rectal- Not done, not indicated Extrem- cyanosis- none, clubbing, none, atrophy- none, strength- nl Neuro- grossly intact to observation

## 2014-06-14 ENCOUNTER — Ambulatory Visit (INDEPENDENT_AMBULATORY_CARE_PROVIDER_SITE_OTHER): Payer: Managed Care, Other (non HMO) | Admitting: Otolaryngology

## 2014-06-14 DIAGNOSIS — H6980 Other specified disorders of Eustachian tube, unspecified ear: Secondary | ICD-10-CM

## 2014-06-14 DIAGNOSIS — J343 Hypertrophy of nasal turbinates: Secondary | ICD-10-CM

## 2014-06-14 DIAGNOSIS — H6990 Unspecified Eustachian tube disorder, unspecified ear: Secondary | ICD-10-CM

## 2014-06-14 DIAGNOSIS — J310 Chronic rhinitis: Secondary | ICD-10-CM

## 2014-07-31 ENCOUNTER — Other Ambulatory Visit: Payer: Self-pay | Admitting: Emergency Medicine

## 2014-07-31 MED ORDER — BECLOMETHASONE DIPROPIONATE 80 MCG/ACT IN AERS
2.0000 | INHALATION_SPRAY | Freq: Two times a day (BID) | RESPIRATORY_TRACT | Status: DC
Start: 2014-07-31 — End: 2014-11-19

## 2014-09-01 NOTE — Assessment & Plan Note (Signed)
She has resumed allergy vaccine as of this spring, well tolerated. We agreed that if she can't tell it is helpful by the end of September, and she will stop it

## 2014-09-01 NOTE — Assessment & Plan Note (Signed)
She complains about a mild sense of mucus "stickiness" , but no active cough or wheeze. Well controlled.

## 2014-09-17 ENCOUNTER — Ambulatory Visit (INDEPENDENT_AMBULATORY_CARE_PROVIDER_SITE_OTHER): Payer: Managed Care, Other (non HMO) | Admitting: *Deleted

## 2014-09-17 ENCOUNTER — Ambulatory Visit (INDEPENDENT_AMBULATORY_CARE_PROVIDER_SITE_OTHER): Payer: Managed Care, Other (non HMO) | Admitting: Internal Medicine

## 2014-09-17 ENCOUNTER — Encounter: Payer: Self-pay | Admitting: Internal Medicine

## 2014-09-17 VITALS — BP 104/60 | HR 111 | Ht 60.0 in | Wt 118.6 lb

## 2014-09-17 DIAGNOSIS — Z23 Encounter for immunization: Secondary | ICD-10-CM

## 2014-09-17 DIAGNOSIS — J45998 Other asthma: Secondary | ICD-10-CM

## 2014-09-17 MED ORDER — PREDNISONE 10 MG PO TABS: ORAL_TABLET | ORAL | Status: DC

## 2014-09-17 NOTE — Progress Notes (Signed)
10/14/11- 63 yoF never smoker followed for allergic rhinitis, rhinosinusitis.  PCP Mickie Hillier LOV- 06/19/10 She comes alone this visit but has many small children and is considering having another. Has had flu vaccine. Did well through the summer. With onset of early fall weather, began noting more of thick mucus. Frequent colds cycle through her family. She doesn't know when her mild sense of head and chest congestion may be from a viral infection. She now has a nebulizer machine with albuterol. This helps "some, a little". Sticky sensation doesn't go away. Can occasionally cough up a little white phlegm. Mucinex helps. We discussed the importance of hydration as the indoor heat comes on and she admits she probably does not drink enough fluid.  06/09/12- 27 yoF never smoker followed for allergic rhinitis, rhinosinusitis. Asthma       PCP Mickie Hillier Pt states having some chest congestion,productive cough,sob,denies any wheezing. Pt states still  taking allergy injections at home . She comes today with 4 small children. She feels as if she is "breathing through a straw" at times but never notices wheezing. A few colds. Occasional cough. Throat feels "sticky". She uses rescue inhaler before exercise. Recognizes no problems with her allergy vaccine.  05/17/13- 77 yoF never smoker followed for allergic rhinitis, rhinosinusitis. Asthma, complicated by hypothyroid, hiatal hernia     PCP Mickie Hillier No antihistamines, OTC cough syrups, or OTC sleep aids in past 3 days. Has been on allergy vaccine 1:10. Concerned that tight feeling in throat with much postnasal drip this spring, increased nasal congestion all may indicate need to retest. Allergy skin testing 05/17/13- significant positives especially for wheeze and tree pollens, cat. This is a significant pattern change since her original testing.  05/17/14- 50 yoF never smoker followed for allergic rhinitis, rhinosinusitis. Asthma, complicated by hypothyroid,  hiatal hernia     PCP Nenzel: Had sinus surgery 01-2014/ Dr Benjamine Mola; throat feels puffy since May 2015-comes and goes Back on Allergy vaccine1:10 GO for 2-3 months. She has a second dog now, and several children as potential sources of virus infections Recently finished amoxicillin for ? Sinusitis, without effect Says acid blocker does not affect heartburn/globus sensation. Complains again of a "stickiness" sensation in her airways.  09/17/14- 8 yoF never smoker followed for allergic rhinitis, rhinosinusitis. Asthma, complicated by hypothyroid, hiatal hernia     PCP Mickie Hillier FOLLOWS FOR: Patient recovering from walking pneumo. She is still having sob, and chest tightness. Patient denies wheezing. No CXR was done. She is "mostly better" after levaquin 10 days, but still some chest tightness. She asks about some predniosne to help clear chest. Denies fever, purulent sputum, nodes.  ROS-see HPI Constitutional:   No-   weight loss, night sweats, fevers, chills, fatigue, lassitude. HEENT:   No-  headaches, difficulty swallowing, tooth/dental problems, sore throat,       No-  sneezing, itching, ear ache, +nasal congestion, +post nasal drip,  CV:  No-   chest pain, orthopnea, PND, swelling in lower extremities, anasarca, dizziness, palpitations Resp: + tightness/ shortness of breath with exertion or at rest.              No-   productive cough,  No non-productive cough,  No- coughing up of blood.              No-   change in color of mucus.  No- wheezing.   Skin: No-   rash or lesions. GI:  No-   heartburn,  indigestion, abdominal pain, nausea, vomiting,  GU:  MS:  No-   joint pain or swelling.   Neuro-     nothing unusual Psych:  No- change in mood or affect. No depression or anxiety.  No memory loss.  OBJ General- Alert, Oriented, Affect-appropriate, Distress- none acute, muscular Skin- rash-none, lesions- none, excoriation- none Lymphadenopathy- none Head- atraumatic             Eyes- Gross vision intact, PERRLA, conjunctivae clear secretions            Ears- Hearing, canals-normal            Nose- Clear, no-Septal dev, mucus, polyps, erosion, perforation             Throat- Mallampati II , mucosa normal , drainage- none, tonsils- atrophic, voice normal Neck- flexible , trachea midline, no stridor , thyroid nl, carotid no bruit Chest - symmetrical excursion , unlabored           Heart/CV- RRR , 1/6 systolicmurmur , no gallop  , no rub, nl s1 s2                           - JVD- none , edema- none, stasis changes- none, varices- none           Lung- clear to P&A, wheeze- none, cough- none , dullness-none, rub- none           Chest wall-  Abd-  Br/ Gen/ Rectal- Not done, not indicated Extrem- cyanosis- none, clubbing, none, atrophy- none, strength- nl Neuro- grossly intact to observation

## 2014-09-17 NOTE — Patient Instructions (Signed)
Prednisone short taper sent  Flu vax  Pneumovax-23

## 2014-09-17 NOTE — Assessment & Plan Note (Signed)
Residual asthmatic bronchitis after clinically dx'd pneumonia.  Plan limited prednisone taper. Steroid talk. Flu and pnemuonia [id number] discussed and given.

## 2014-09-21 ENCOUNTER — Encounter: Payer: Self-pay | Admitting: Internal Medicine

## 2014-09-24 ENCOUNTER — Other Ambulatory Visit (HOSPITAL_COMMUNITY): Payer: Self-pay | Admitting: Family Medicine

## 2014-09-24 ENCOUNTER — Ambulatory Visit (HOSPITAL_COMMUNITY)
Admission: RE | Admit: 2014-09-24 | Discharge: 2014-09-24 | Disposition: A | Discharge status: Home / Self Care | Payer: Managed Care, Other (non HMO) | Source: Ambulatory Visit | Attending: Family Medicine | Admitting: Family Medicine

## 2014-09-24 ENCOUNTER — Telehealth: Payer: Self-pay | Admitting: Internal Medicine

## 2014-09-24 DIAGNOSIS — R0602 Shortness of breath: Secondary | ICD-10-CM | POA: Insufficient documentation

## 2014-09-24 DIAGNOSIS — R05 Cough: Secondary | ICD-10-CM

## 2014-09-24 DIAGNOSIS — R079 Chest pain, unspecified: Secondary | ICD-10-CM | POA: Diagnosis not present

## 2014-09-24 DIAGNOSIS — R059 Cough, unspecified: Secondary | ICD-10-CM

## 2014-09-24 NOTE — Telephone Encounter (Signed)
Called spoke with pt. She had CXR done today by PCP (in epic). Pt is wanting CDY to review this as well. She was told CXR normal. Pt reports this past week she has been feeling "spasms" in her lungs. Please advise Dr. Annamaria Boots thanks  --pt aware she will get call back tomorrow and was fine with this

## 2014-09-25 MED ORDER — AMOXICILLIN-POT CLAVULANATE 875-125 MG PO TABS: 1.0000 | ORAL_TABLET | Freq: Two times a day (BID) | ORAL | Status: DC

## 2014-09-25 NOTE — Telephone Encounter (Signed)
I reviewed CXR and it looks clear. Question if what she feels could be muscle spasms in esophagus or chest wall muscles. Feelings from lung might be associated with wheeze or cough, might be eased by using rescue inhaler or affected by standing in steamy shower.

## 2014-09-25 NOTE — Telephone Encounter (Signed)
LMTC x 1  

## 2014-09-25 NOTE — Telephone Encounter (Signed)
Offer augmentin 875, # 20, 1 twice daily  Consider also taking a probiotic like Clinical cytogeneticist

## 2014-09-25 NOTE — Telephone Encounter (Signed)
Still awaiting CDY response. Please advise thanks

## 2014-09-25 NOTE — Telephone Encounter (Signed)
Pt returned call & can be reached at [phone].  Satira Anis

## 2014-09-25 NOTE — Telephone Encounter (Signed)
Spoke with the pt and notified of recs per CDY  She verbalized understanding and denied any questions  Rx was sent to pharm

## 2014-09-25 NOTE — Telephone Encounter (Signed)
Spoke with pt and advised of Dr Janee Morn recommendations.  Pt verbalized understanding and states that she is stilling having lots of sinus congestion and drainage (yellow- white), terrible taste in mucus, tonsils feel swollen and irritated.  Finished Levaquin about a week ago.  Pt wonders if she may need another abx.  Please advise.  Allergies  Allergen Reactions  . Sulfonamide Derivatives Anaphylaxis    Current Outpatient Prescriptions on File Prior to Visit  Medication Sig Dispense Refill  . albuterol (PROVENTIL HFA) 108 (90 BASE) MCG/ACT inhaler Inhale 2 puffs into the lungs 4 (four) times daily as needed. For shortness of breath    . albuterol (PROVENTIL) (2.5 MG/3ML) 0.083% nebulizer solution Take 3 mLs (2.5 mg total) by nebulization 4 (four) times daily as needed. For shortness of breath 120 mL 5  . Azelastine HCl (ASTEPRO) 0.15 % SOLN Place 2 puffs into the nose at bedtime.      . beclomethasone (QVAR) 80 MCG/ACT inhaler Inhale 2 puffs into the lungs 2 (two) times daily. 3 Inhaler 3  . benzonatate (TESSALON) 200 MG capsule Take 200 mg by mouth daily as needed.  1  . cetirizine (ZYRTEC) 10 MG tablet Take 10 mg by mouth daily.      Marland Kitchen EPINEPHrine (EPI-PEN) 0.3 mg/0.3 mL SOAJ injection Inject 0.3 mLs (0.3 mg total) into the muscle once. 1 Device 0  . ferrous fumarate (HEMOCYTE - 106 MG FE) 325 (106 FE) MG TABS tablet Take 1 tablet by mouth daily.    Marland Kitchen guaiFENesin (MUCINEX) 600 MG 12 hr tablet Take 1,200 mg by mouth 2 (two) times daily.      Marland Kitchen levothyroxine (SYNTHROID, LEVOTHROID) 25 MCG tablet Take 25 mcg by mouth daily before breakfast.     . montelukast (SINGULAIR) 10 MG tablet Take 10 mg by mouth at bedtime.      . Multiple Vitamin (MULITIVITAMIN WITH MINERALS) TABS Take 1 tablet by mouth daily.    . predniSONE (DELTASONE) 10 MG tablet 2 x 2 days, 1 x 2 days, then stop 6 tablet 0  . PRESCRIPTION MEDICATION Low dose estrogen for birth control    . RESTASIS 0.05 % ophthalmic emulsion Place  0.05 mLs into both eyes 2 (two) times daily.  0  . TUBERCULIN SYR 1CC/25GX5/8" 25G X 5/8" 1 ML MISC Use to give allergy vaccine or other medications as directed 100 each 11  . valACYclovir (VALTREX) 1000 MG tablet Take 1 tablet by mouth daily as needed.      No current facility-administered medications on file prior to visit.

## 2014-10-01 ENCOUNTER — Telehealth: Payer: Self-pay | Admitting: Internal Medicine

## 2014-10-01 MED ORDER — FLUCONAZOLE 150 MG PO TABS: 150.0000 mg | ORAL_TABLET | Freq: Every day | ORAL | Status: DC

## 2014-10-01 NOTE — Telephone Encounter (Signed)
No recent diflucan in pt's chart - last refill was 06/2012 Per Katie okay for Diflucan 150mg  #3  Called spoke with patient, advised of pending rx Pt voiced her understanding and rx has been sent to verified pharmacy Nothing further needed at this time; will sign off

## 2014-10-01 NOTE — Telephone Encounter (Signed)
Called and spoke to pt. Pt c/o the beginning of thrush and a vaginal yeast infection d/t Augmentin that was given on 11/10. Pt states otherwise her SOB, cough and congestion are improving and is starting to feel better. Pt states she is taking a probiotic daily but always gets a yeast infection with antibiotics. Pt requesting recs for yeast infection. Pt last seen on 09/17/14.  CY please advise.   Allergies  Allergen Reactions  . Sulfonamide Derivatives Anaphylaxis    Current Outpatient Prescriptions on File Prior to Visit  Medication Sig Dispense Refill  . albuterol (PROVENTIL HFA) 108 (90 BASE) MCG/ACT inhaler Inhale 2 puffs into the lungs 4 (four) times daily as needed. For shortness of breath    . albuterol (PROVENTIL) (2.5 MG/3ML) 0.083% nebulizer solution Take 3 mLs (2.5 mg total) by nebulization 4 (four) times daily as needed. For shortness of breath 120 mL 5  . amoxicillin-clavulanate (AUGMENTIN) 875-125 MG per tablet Take 1 tablet by mouth 2 (two) times daily. 20 tablet 0  . Azelastine HCl (ASTEPRO) 0.15 % SOLN Place 2 puffs into the nose at bedtime.      . beclomethasone (QVAR) 80 MCG/ACT inhaler Inhale 2 puffs into the lungs 2 (two) times daily. 3 Inhaler 3  . benzonatate (TESSALON) 200 MG capsule Take 200 mg by mouth daily as needed.  1  . cetirizine (ZYRTEC) 10 MG tablet Take 10 mg by mouth daily.      Marland Kitchen EPINEPHrine (EPI-PEN) 0.3 mg/0.3 mL SOAJ injection Inject 0.3 mLs (0.3 mg total) into the muscle once. 1 Device 0  . ferrous fumarate (HEMOCYTE - 106 MG FE) 325 (106 FE) MG TABS tablet Take 1 tablet by mouth daily.    Marland Kitchen guaiFENesin (MUCINEX) 600 MG 12 hr tablet Take 1,200 mg by mouth 2 (two) times daily.      Marland Kitchen levothyroxine (SYNTHROID, LEVOTHROID) 25 MCG tablet Take 25 mcg by mouth daily before breakfast.     . montelukast (SINGULAIR) 10 MG tablet Take 10 mg by mouth at bedtime.      . Multiple Vitamin (MULITIVITAMIN WITH MINERALS) TABS Take 1 tablet by mouth daily.    .  predniSONE (DELTASONE) 10 MG tablet 2 x 2 days, 1 x 2 days, then stop 6 tablet 0  . PRESCRIPTION MEDICATION Low dose estrogen for birth control    . RESTASIS 0.05 % ophthalmic emulsion Place 0.05 mLs into both eyes 2 (two) times daily.  0  . TUBERCULIN SYR 1CC/25GX5/8" 25G X 5/8" 1 ML MISC Use to give allergy vaccine or other medications as directed 100 each 11  . valACYclovir (VALTREX) 1000 MG tablet Take 1 tablet by mouth daily as needed.      No current facility-administered medications on file prior to visit.

## 2014-10-01 NOTE — Telephone Encounter (Signed)
Per CY-okay to give refill on diflucan from most recent visit. Thanks.

## 2014-10-08 ENCOUNTER — Ambulatory Visit (INDEPENDENT_AMBULATORY_CARE_PROVIDER_SITE_OTHER): Payer: Managed Care, Other (non HMO)

## 2014-10-08 DIAGNOSIS — J309 Allergic rhinitis, unspecified: Secondary | ICD-10-CM

## 2014-11-07 ENCOUNTER — Telehealth: Payer: Self-pay | Admitting: Internal Medicine

## 2014-11-07 NOTE — Telephone Encounter (Signed)
This may be a viral rash she coaught from son. Just treat with soothing skin moisturizers like Lubriderm or Eucerin for a few days and see if it resolves.

## 2014-11-07 NOTE — Telephone Encounter (Signed)
Called and spoke to pt. Informed pt of the recs per CY. Pt verbalized understanding and denied any further questions or concerns at this time.

## 2014-11-07 NOTE — Telephone Encounter (Signed)
Called and spoke to pt. Pt stated her son recently developed a rash 1.5 weeks ago and had a work up and didn't find a trigger. Pt stated 3 days ago she developed a similar rash. Pt c/o generalized dry itchy rash. Pt stated the rash is not raised. Pt stated she has not had a change in soap, detergent, lotion, etc. Pt also c/o mild increase in SOB, fatigue and chills without fever. Pt last seen on 09/17/14 by CY.   Dr. Annamaria Boots please advise.   Allergies  Allergen Reactions  . Sulfonamide Derivatives Anaphylaxis     Current Outpatient Prescriptions on File Prior to Visit  Medication Sig Dispense Refill  . albuterol (PROVENTIL HFA) 108 (90 BASE) MCG/ACT inhaler Inhale 2 puffs into the lungs 4 (four) times daily as needed. For shortness of breath    . albuterol (PROVENTIL) (2.5 MG/3ML) 0.083% nebulizer solution Take 3 mLs (2.5 mg total) by nebulization 4 (four) times daily as needed. For shortness of breath 120 mL 5  . amoxicillin-clavulanate (AUGMENTIN) 875-125 MG per tablet Take 1 tablet by mouth 2 (two) times daily. 20 tablet 0  . Azelastine HCl (ASTEPRO) 0.15 % SOLN Place 2 puffs into the nose at bedtime.      . beclomethasone (QVAR) 80 MCG/ACT inhaler Inhale 2 puffs into the lungs 2 (two) times daily. 3 Inhaler 3  . benzonatate (TESSALON) 200 MG capsule Take 200 mg by mouth daily as needed.  1  . cetirizine (ZYRTEC) 10 MG tablet Take 10 mg by mouth daily.      Marland Kitchen EPINEPHrine (EPI-PEN) 0.3 mg/0.3 mL SOAJ injection Inject 0.3 mLs (0.3 mg total) into the muscle once. 1 Device 0  . ferrous fumarate (HEMOCYTE - 106 MG FE) 325 (106 FE) MG TABS tablet Take 1 tablet by mouth daily.    . fluconazole (DIFLUCAN) 150 MG tablet Take 1 tablet (150 mg total) by mouth daily. 3 tablet 0  . guaiFENesin (MUCINEX) 600 MG 12 hr tablet Take 1,200 mg by mouth 2 (two) times daily.      Marland Kitchen levothyroxine (SYNTHROID, LEVOTHROID) 25 MCG tablet Take 25 mcg by mouth daily before breakfast.     . montelukast (SINGULAIR) 10 MG  tablet Take 10 mg by mouth at bedtime.      . Multiple Vitamin (MULITIVITAMIN WITH MINERALS) TABS Take 1 tablet by mouth daily.    . predniSONE (DELTASONE) 10 MG tablet 2 x 2 days, 1 x 2 days, then stop 6 tablet 0  . PRESCRIPTION MEDICATION Low dose estrogen for birth control    . RESTASIS 0.05 % ophthalmic emulsion Place 0.05 mLs into both eyes 2 (two) times daily.  0  . TUBERCULIN SYR 1CC/25GX5/8" 25G X 5/8" 1 ML MISC Use to give allergy vaccine or other medications as directed 100 each 11  . valACYclovir (VALTREX) 1000 MG tablet Take 1 tablet by mouth daily as needed.      No current facility-administered medications on file prior to visit.

## 2014-11-19 ENCOUNTER — Telehealth: Payer: Self-pay | Admitting: Internal Medicine

## 2014-11-19 MED ORDER — BECLOMETHASONE DIPROPIONATE 80 MCG/ACT IN AERS: 2.0000 | INHALATION_SPRAY | Freq: Two times a day (BID) | RESPIRATORY_TRACT | Status: DC

## 2014-11-19 MED ORDER — ALBUTEROL SULFATE (2.5 MG/3ML) 0.083% IN NEBU: 2.5000 mg | INHALATION_SOLUTION | Freq: Four times a day (QID) | RESPIRATORY_TRACT | Status: DC | PRN

## 2014-11-19 MED ORDER — MONTELUKAST SODIUM 10 MG PO TABS
10.0000 mg | ORAL_TABLET | Freq: Every day | ORAL | Status: DC
Start: 2014-11-19 — End: 2015-11-13

## 2014-11-19 NOTE — Telephone Encounter (Signed)
Refills verified with the pt and sent to requested pharmacy. Pt aware. [place] Bing, CMA

## 2014-11-30 ENCOUNTER — Telehealth: Payer: Self-pay

## 2014-11-30 NOTE — Telephone Encounter (Signed)
Pt aware. She is still waiting on PCP. States she will call back for appt. If she gets worse.

## 2014-11-30 NOTE — Telephone Encounter (Signed)
Agree with need for her to follow up with PCP at this time. We last saw her in 2014. She will need OV to evaluate her symptoms and she should follow up with her PCP in the interim. Go to ER if pain severe persistent.

## 2014-11-30 NOTE — Telephone Encounter (Addendum)
Pt states she has been cramping. Having bowel changes and has went to her primary on 11/29/2014. Pt states that she had some rough nights this week. States she is having interment pain and when the pain hits it is a 10.  States she has had no diarrhea. No vomiting. States she feels bloated and abd pain.  Told pt to call her primary and see what they say about her tests they did yesterday.  Told her if pain got really severe to go to hospital

## 2014-12-03 NOTE — Telephone Encounter (Signed)
Open in error

## 2014-12-03 NOTE — Telephone Encounter (Signed)
Pt is calling back because she is still having so bowels changes. She is cramping and having more bowel movement more then normal and the shapes has also change. She feel like she still need to go when she gets up. She just feels like something is wrong. Lot of gas and bloating. She was on anti-bx on 11-24-14 from urgent care but they did not help. Please advise

## 2014-12-03 NOTE — Telephone Encounter (Signed)
Needs an OV. Unable to give recommendations over the phone as we have not seen her since May 2014.

## 2014-12-03 NOTE — Telephone Encounter (Signed)
Called and left message for her to call back.

## 2014-12-11 NOTE — Telephone Encounter (Signed)
[name] [name] can you make her an appointment and mail her a card.  Thanks Cristiano Capri

## 2014-12-12 ENCOUNTER — Encounter: Payer: Self-pay | Admitting: Gastroenterology

## 2014-12-12 ENCOUNTER — Telehealth: Payer: Self-pay | Admitting: Gastroenterology

## 2014-12-12 NOTE — Telephone Encounter (Signed)
As mentioned on numerous occasions in the last couple of weeks, patient needs OV prior to further recommendations.  However, I don't feel we should send stool specimen to labs given her description of "soft stool". They will reject the specimen anyway. Keep OV as scheduled.

## 2014-12-12 NOTE — Telephone Encounter (Signed)
Patient has an appointment 12/25/14

## 2014-12-12 NOTE — Telephone Encounter (Signed)
PATIENT CALLED STATING THAT SHE COLLECTED HER OWN STOOL SAMPLE THIS MORNING AND WANTS TO KNOW IF SHE CAN HAVE IT SENT TO THE LAB.  HER ORIGINAL SYMPTOMS OF GETTING UP AND GOING TO THE BATHROOM ALL NIGHT HAVE RETURNED.  HAS APPOINTMENT February.   PLEASE CALL

## 2014-12-12 NOTE — Telephone Encounter (Signed)
I called pt and she said she is not having diarrhea, just more frequent soft stools. She has a slight abdominal pain most of the time and on occasions it is worse. The pain is right around her umbilical area.   Yesterday she had some nausea for awhile, and felt like she would vomit but never did. She wanted to know if she could send over her soft stool samples to the lab that she collected at home this morning. She has not been seen since 03/2013 and I told her that we usually do not order labs if we haven't seen someone in that length of time. She has appt on 12/25/2014 at 9:00 AM with Neil Crouch, PA. I told her I will let Magda Paganini know and see if she has recommendations.

## 2014-12-12 NOTE — Telephone Encounter (Signed)
I called and informed pt. She will keep OV and call if she worsens.

## 2014-12-19 ENCOUNTER — Other Ambulatory Visit: Payer: Self-pay | Admitting: Gastroenterology

## 2014-12-25 ENCOUNTER — Other Ambulatory Visit (HOSPITAL_COMMUNITY)
Admission: RE | Admit: 2014-12-25 | Discharge: 2014-12-25 | Disposition: A | Discharge status: Home / Self Care | Payer: Managed Care, Other (non HMO) | Source: Ambulatory Visit | Attending: Gastroenterology | Admitting: Gastroenterology

## 2014-12-25 ENCOUNTER — Encounter: Payer: Self-pay | Admitting: Gastroenterology

## 2014-12-25 ENCOUNTER — Ambulatory Visit (INDEPENDENT_AMBULATORY_CARE_PROVIDER_SITE_OTHER): Payer: Managed Care, Other (non HMO) | Admitting: Gastroenterology

## 2014-12-25 VITALS — BP 126/68 | HR 96 | Temp 97.4°F | Ht 60.0 in | Wt 115.0 lb

## 2014-12-25 DIAGNOSIS — D509 Iron deficiency anemia, unspecified: Secondary | ICD-10-CM | POA: Insufficient documentation

## 2014-12-25 DIAGNOSIS — R1013 Epigastric pain: Secondary | ICD-10-CM | POA: Insufficient documentation

## 2014-12-25 DIAGNOSIS — K219 Gastro-esophageal reflux disease without esophagitis: Secondary | ICD-10-CM

## 2014-12-25 DIAGNOSIS — R5383 Other fatigue: Secondary | ICD-10-CM

## 2014-12-25 LAB — COMPREHENSIVE METABOLIC PANEL
ALBUMIN: 4.2 g/dL (ref 3.5–5.2)
ALT: 20 U/L (ref 0–35)
AST: 18 U/L (ref 0–37)
Alkaline Phosphatase: 48 U/L (ref 39–117)
BUN: 15 mg/dL (ref 6–23)
CO2: 27 mEq/L (ref 19–32)
Calcium: 9.5 mg/dL (ref 8.4–10.5)
Chloride: 104 mEq/L (ref 96–112)
Creat: 0.72 mg/dL (ref 0.50–1.10)
Glucose, Bld: 88 mg/dL (ref 70–99)
Potassium: 4.3 mEq/L (ref 3.5–5.3)
SODIUM: 140 meq/L (ref 135–145)
TOTAL PROTEIN: 7.3 g/dL (ref 6.0–8.3)
Total Bilirubin: 0.4 mg/dL (ref 0.2–1.2)

## 2014-12-25 LAB — CBC WITH DIFFERENTIAL/PLATELET
BASOS ABS: 0.1 10*3/uL (ref 0.0–0.1)
Basophils Relative: 1 % (ref 0–1)
EOS PCT: 1 % (ref 0–5)
Eosinophils Absolute: 0.1 10*3/uL (ref 0.0–0.7)
HCT: 36.8 % (ref 36.0–46.0)
Hemoglobin: 11.8 g/dL — ABNORMAL LOW (ref 12.0–15.0)
LYMPHS PCT: 20 % (ref 12–46)
Lymphs Abs: 1.3 10*3/uL (ref 0.7–4.0)
MCH: 28.9 pg (ref 26.0–34.0)
MCHC: 32.1 g/dL (ref 30.0–36.0)
MCV: 90.2 fL (ref 78.0–100.0)
MONO ABS: 0.5 10*3/uL (ref 0.1–1.0)
MPV: 10.5 fL (ref 8.6–12.4)
Monocytes Relative: 7 % (ref 3–12)
NEUTROS ABS: 4.7 10*3/uL (ref 1.7–7.7)
Neutrophils Relative %: 71 % (ref 43–77)
Platelets: 393 10*3/uL (ref 150–400)
RBC: 4.08 MIL/uL (ref 3.87–5.11)
RDW: 13.6 % (ref 11.5–15.5)
WBC: 6.6 10*3/uL (ref 4.0–10.5)

## 2014-12-25 LAB — IRON AND TIBC
%SAT: 12 % — AB (ref 20–55)
Iron: 41 ug/dL — ABNORMAL LOW (ref 42–145)
TIBC: 346 ug/dL (ref 250–470)
UIBC: 305 ug/dL (ref 125–400)

## 2014-12-25 LAB — LIPASE: Lipase: 21 U/L (ref 0–75)

## 2014-12-25 NOTE — Patient Instructions (Addendum)
1. Please have your labs done. I am checking your kidneys, liver, iron, and for celiac disease.  2. I have requested copy of your stool studies for review. 3. Once we have your results, we will provide you with further instructions.

## 2014-12-25 NOTE — Progress Notes (Signed)
Primary Care Physician:  Jana Half  Primary Gastroenterologist:  Barney Drain, MD   Chief Complaint  Patient presents with  . Blood In Stools    HPI:  [name] [name] is a 44 y.o. female here for further evaluation of blood in her stool. She has h/o GERD/IBS. Last seen in 03/2013. TCS/EGD 12/2011 with adenomas, mild gastritis. Next surveillance TCS in 2018.   Patient with multiple GI complaints. Symptoms started back in June. States she had oral thrush. Developed abdominal pain. Was treated with Nystatin and then Diflucan and seemed to get better. Over the past few months she has had multiple rounds of antibiotics from Augmentin in 09/2014, Tetracycline in 10/2014, Levaquin and flagyl 11/2014. First of December, sudden onset dry/bad taste in mouth with irritation. Then developed white plaques in mouth following by upper abdominal ache/pain associated with fatigue. Thinks she has too much yeast in her system. Took Nystatin but didn't help. The given Diflucan QOD for 5 doses.abdominal pain some better. BM were loose. Stool studies done but she is not aware of results. BM daily now, softer but formed. Small brbpr X 3 in beginning back earlier in December when having abdominal pain. Hemorrhoids acting up. +gas. No weight loss. No significant heartburn. No odynophagia, dysphagia. Takes Aciphex BID.   Current Outpatient Prescriptions  Medication Sig Dispense Refill  . albuterol (PROVENTIL HFA) 108 (90 BASE) MCG/ACT inhaler Inhale 2 puffs into the lungs 4 (four) times daily as needed. For shortness of breath    . albuterol (PROVENTIL) (2.5 MG/3ML) 0.083% nebulizer solution Take 3 mLs (2.5 mg total) by nebulization 4 (four) times daily as needed. For shortness of breath 120 mL 5  . Azelastine HCl (ASTEPRO) 0.15 % SOLN Place 2 puffs into the nose at bedtime.      . beclomethasone (QVAR) 80 MCG/ACT inhaler Inhale 2 puffs into the lungs 2 (two) times daily. 1 Inhaler 6  . benzonatate (TESSALON) 200  MG capsule Take 200 mg by mouth daily as needed.  1  . cetirizine (ZYRTEC) 10 MG tablet Take 10 mg by mouth daily.      Marland Kitchen EPINEPHrine (EPI-PEN) 0.3 mg/0.3 mL SOAJ injection Inject 0.3 mLs (0.3 mg total) into the muscle once. 1 Device 0  . ferrous fumarate (HEMOCYTE - 106 MG FE) 325 (106 FE) MG TABS tablet Take 1 tablet by mouth daily.    Marland Kitchen guaiFENesin (MUCINEX) 600 MG 12 hr tablet Take 1,200 mg by mouth 2 (two) times daily.      Marland Kitchen levothyroxine (SYNTHROID, LEVOTHROID) 25 MCG tablet Take 25 mcg by mouth daily before breakfast.     . montelukast (SINGULAIR) 10 MG tablet Take 1 tablet (10 mg total) by mouth at bedtime. 30 tablet 6  . Multiple Vitamin (MULITIVITAMIN WITH MINERALS) TABS Take 1 tablet by mouth daily.    Marland Kitchen PRESCRIPTION MEDICATION Low dose estrogen for birth control    . RABEprazole (ACIPHEX) 20 MG tablet 20 mg 2 (two) times daily.     No current facility-administered medications for this visit.    Allergies as of 12/25/2014 - Review Complete 12/25/2014  Allergen Reaction Noted  . Sulfonamide derivatives Anaphylaxis     Past Medical History  Diagnosis Date  . Chronic rhinosinusitis   . Allergic rhinitis   . Cough   . Cardiac murmur   . HSV infection     History of-outbreak during pregnancy 2004  . Mono exposure     Had in January  . Thrush, oral   .  Yeast infection of the vagina   . Mononucleosis 01/06/2012    Past Surgical History  Procedure Laterality Date  . Cesarean section  2002,2004,2008, 2009  . Nasal sinus surgery  2005  . Colonoscopy  12/25/2011    Dr. Oneida Alar: small internal hemorrhoids, adenomas, surveillance in 2018  . Esophagogastroduodenoscopy  Feb 2013    Dr. Oneida Alar: normal stomach, duodenum, esophagus. Gastritis. hiatal hernia    Family History  Problem Relation Age of Onset  . Allergies Sister   . Allergies Brother   . Cervical cancer Mother   . Colon polyps Mother 79    several "benign" polyps  . Cancer Mother   . Breast cancer       grandmother  . Colon cancer Neg Hx   . Cancer Maternal Grandmother     History   Social History  . Marital Status: Married    Spouse Name: N/A    Number of Children: 4  . Years of Education: N/A   Occupational History  . Housewife-recent accounting degree.    Social History Main Topics  . Smoking status: Never Smoker   . Smokeless tobacco: Never Used  . Alcohol Use: No  . Drug Use: No  . Sexual Activity: Not on file   Other Topics Concern  . Not on file   Social History Narrative      ROS:  General: Negative for anorexia, weight loss, fever, chills. C/o fatigue. Eyes: Negative for vision changes.  ENT: Negative for hoarseness, difficulty swallowing , nasal congestion. CV: Negative for chest pain, angina, palpitations, dyspnea on exertion, peripheral edema.  Respiratory: Negative for dyspnea at rest, dyspnea on exertion, cough, sputum, wheezing.  GI: See history of present illness. GU:  Negative for dysuria, hematuria, urinary incontinence, urinary frequency, nocturnal urination.  MS: Negative for joint pain, low back pain.  Derm: Negative for rash or itching.  Neuro: Negative for weakness, abnormal sensation, seizure, frequent headaches, memory loss, confusion.  Psych: Negative for anxiety, depression, suicidal ideation, hallucinations.  Endo: Negative for unusual weight change.  Heme: Negative for bruising or bleeding. Allergy: Negative for rash or hives.    Physical Examination:  BP 126/68 mmHg  Pulse 96  Temp(Src) 97.4 F (36.3 C) (Oral)  Ht 5' (1.524 m)  Wt 115 lb (52.164 kg)  BMI 22.46 kg/m2   General: Well-nourished, well-developed in no acute distress.  Head: Normocephalic, atraumatic.   Eyes: Conjunctiva pink, no icterus. Mouth: Oropharyngeal mucosa moist and pink , no lesions erythema or exudate. Neck: Supple without thyromegaly, masses, or lymphadenopathy.  Lungs: Clear to auscultation bilaterally.  Heart: Regular rate and rhythm, no murmurs  rubs or gallops.  Abdomen: Bowel sounds are normal, nontender, nondistended, no hepatosplenomegaly or masses, no abdominal bruits or    hernia , no rebound or guarding.   Rectal: not done Extremities: No lower extremity edema. No clubbing or deformities.  Neuro: Alert and oriented x 4 , grossly normal neurologically.  Skin: Warm and dry, no rash or jaundice.   Psych: Alert and cooperative, normal mood and affect.  Labs: requested  Imaging Studies: No results found.

## 2014-12-26 LAB — FERRITIN: Ferritin: 39 ng/mL (ref 10–291)

## 2014-12-26 LAB — TISSUE TRANSGLUTAMINASE, IGA: TISSUE TRANSGLUTAMINASE AB, IGA: 1 U/mL (ref <4)

## 2014-12-28 ENCOUNTER — Telehealth: Payer: Self-pay

## 2014-12-28 NOTE — Telephone Encounter (Signed)
Pt was calling to see if blood work was back. She also talked with her PCP about culture her mouth and they told her that at this time she did not need it done with them and she could have it done with [place] if we feel like she needs to have it done.

## 2014-12-30 NOTE — Assessment & Plan Note (Signed)
Abdominal pain now resolved. Patient reports epigastric pain in setting of oral thrush treated with several rounds of Nystatin and diflucan. No evidence of thrush on exam today. Doubt abdominal pain related. ?dyspepsia, gastritis, GERD related. Continue Aciphex 20mg  BID for now.

## 2014-12-30 NOTE — Assessment & Plan Note (Signed)
Patient reports IDA per PCP. Complains of fatigue for two months, acute onset. Labs requested. Complains of bloating/discomfort. Will check celiac serologies, iron, renal function. Further recommendations once results and records reviewed.

## 2015-01-01 NOTE — Telephone Encounter (Signed)
Pt is aware.  

## 2015-01-01 NOTE — Telephone Encounter (Signed)
No need for mouth culture at this time. See addendum to OV note. Thanks!

## 2015-01-01 NOTE — Progress Notes (Signed)
Received labs and stool studies from PCP dated 11/2014.  H. pylori serologies were negative. Stool culture, O&P, white blood cell count negative. C. difficile toxins a and B negative.  Reviewed labs I ordered from 12/25/14. Slightly low Hgb with low iron/sat, low normal ferritin. CMET, celiac screen unremarkable.  Please let patient know that I am waiting to hear back from Dr. Oneida Alar. She may need TCS/EGD in setting of epigastric pain, anemia with trend towards IDA. Patient does not have vaginal blood loss on oral contraceptives.

## 2015-01-01 NOTE — Progress Notes (Signed)
Quick Note:  Pt is aware of results. ______ 

## 2015-01-01 NOTE — Progress Notes (Signed)
Quick Note:  LMOM to call. ______ 

## 2015-01-01 NOTE — Progress Notes (Signed)
Quick Note:  Mild anemia with trend towards IDA. Significant change in iron, ferritin in last one year. Celiac screen negative, liver and kidney labs normal.   I am waiting to hear from [facility], may offer patient TCS/EGD. See OV note addendum. ______

## 2015-01-03 NOTE — Progress Notes (Signed)
REVIEWED LABS. NL FERRITIN-Hb 11.8. WOULD. CHANGE IN BOWEL HABITS MOST LIKELY DUE TO GI UPSET FROM MULTIPLE ANTIMICROBIALS. PROCEED WITH TCS FOR RECTAL BLEEDING. NO INDICATION FOR EGD AT THIS TIME.

## 2015-01-05 NOTE — Progress Notes (Signed)
CC'ED TO PCP 

## 2015-01-13 ENCOUNTER — Telehealth: Payer: Self-pay | Admitting: Gastroenterology

## 2015-01-13 NOTE — Telephone Encounter (Signed)
See OV note addendum for details.  Per [facility], schedule TCS for rectal bleeding. No need for EGD. Hold iron 7 days before.

## 2015-01-14 NOTE — Telephone Encounter (Signed)
Tried to call with no answer  

## 2015-01-15 NOTE — Telephone Encounter (Signed)
Noted that patient is scheduled for [place] banding this week.  I would prefer she have her colonoscopy prior to [place] banding.  Other option is to consider colonoscopy +/- hemorrhoid banding at time of procedure if felt appropriate by Dr. Oneida Alar.   Please find out what patient would like to do.  Advantages to [place] banding done in the office, there is less pain involved since typically only one band placed at a time. Disadvantage, will require 2-3 sessions.   Hemorrhoid banding done at time of colonoscopy, involves multiple bands placed, however will likely have more discomfort. Advantage, may be treated in one session.    I WOULD BE GLAD TO DISCUSS WITH PATIENT IF SHE HAS QUESTIONS.

## 2015-01-15 NOTE — Telephone Encounter (Signed)
TRIED TO CALL NO ANSWER

## 2015-01-17 ENCOUNTER — Ambulatory Visit (INDEPENDENT_AMBULATORY_CARE_PROVIDER_SITE_OTHER): Payer: Managed Care, Other (non HMO) | Admitting: Gastroenterology

## 2015-01-17 ENCOUNTER — Encounter: Payer: Self-pay | Admitting: Gastroenterology

## 2015-01-17 VITALS — BP 131/74 | HR 110 | Temp 97.2°F | Ht 60.0 in | Wt 115.4 lb

## 2015-01-17 DIAGNOSIS — K6289 Other specified diseases of anus and rectum: Secondary | ICD-10-CM

## 2015-01-17 DIAGNOSIS — K649 Unspecified hemorrhoids: Secondary | ICD-10-CM

## 2015-01-17 DIAGNOSIS — R1013 Epigastric pain: Secondary | ICD-10-CM

## 2015-01-17 DIAGNOSIS — R682 Dry mouth, unspecified: Secondary | ICD-10-CM | POA: Insufficient documentation

## 2015-01-17 DIAGNOSIS — D509 Iron deficiency anemia, unspecified: Secondary | ICD-10-CM

## 2015-01-17 NOTE — Assessment & Plan Note (Signed)
ASSOCIATED WITH RARE RECTAL BLEEDING. TCS UTD. REVIEWED FILMS FROM 2013.  [facility] BAND TODAY COMPLETE FLEX SIG/IH BANDING IN 3 WEEKS. FOLLOW UP IN 4 MOS.

## 2015-01-17 NOTE — Assessment & Plan Note (Signed)
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  SEE ENT AT [facility]

## 2015-01-17 NOTE — Telephone Encounter (Signed)
Pt has an office visit on 01/17/2015

## 2015-01-17 NOTE — Progress Notes (Signed)
Subjective:    Patient ID: [name] [name], female    DOB: Apr 04, 1971, 44 y.o.   MRN: [id number]  Delman Cheadle, PA-C  HPI NOW WORKING OUT AS MUCH. WAS HAVING A LOT OF ABDOMINAL PAIN. WASN'T REALLY TAKING ANY SUPPLEMENTS. CAME ON SUDDENLY IN DEC 2015 IN THE EPIGASTRIUM AND NAUSEA AND LIKE IT WAS LOCKED UP. ACHY AND DRIVING. KEPT HER UP AT NIGHT BUT NO DIARRHEA. FREQUENT FORMED SMALL/NL) CALIBER(2-3 AT NIGHT). JUST WOKE UP ONE AM IT WAS GONE SX LASTED FOR 3-4 DAYS AND LINGERED FOR 2-3 WEEKS. ASSOCIATED WITH T 99-157f/CHILLS OFF AND ON. RARE BRBPR, NAUSEA-CONSTANT DURING SEVERE EPISODE. BMs: ONCE DAILY. CAN INCOMPLETELY EVACUATE. DOES HAVE CONSTIPATION. LAST ABD PAIN: 2-3 MILD EPISODE AND THEY WOULD LAST COUPLE HOURS. MOUTH WAS ACTING UP(FOR YEARS-WORSE SINCE DEC 2015-BURNING ALL THE TIME).  PT DENIES HEMATOCHEZIA, vomiting, melena, diarrhea, CHEST PAIN, SHORTNESS OF BREATH,  problems swallowing, OR problems with sedation, heartburn or indigestion.  Past Medical History  Diagnosis Date  . Chronic rhinosinusitis   . Allergic rhinitis   . Cough   . Cardiac murmur   . HSV infection     History of-outbreak during pregnancy 2004  . Mono exposure     Had in January  . Thrush, oral   . Yeast infection of the vagina   . Mononucleosis 01/06/2012   Past Surgical History  Procedure Laterality Date  . Cesarean section  2002,2004,2008, 2009  . Nasal sinus surgery  2005  . Colonoscopy  12/25/2011    Dr. Oneida Alar: small internal hemorrhoids, adenomas, surveillance in 2018  . Esophagogastroduodenoscopy  Feb 2013    Dr. Oneida Alar: normal stomach, duodenum, esophagus. Gastritis. hiatal hernia   Allergies  Allergen Reactions  . Sulfonamide Derivatives Anaphylaxis   Current Outpatient Prescriptions  Medication Sig Dispense Refill  . albuterol (PROVENTIL HFA) 108 (90 BASE) MCG/ACT inhaler Inhale 2 puffs into the lungs 4 (four) times daily as needed. For shortness of breath    . albuterol (PROVENTIL)  (2.5 MG/3ML) 0.083% nebulizer solution Take 3 mLs (2.5 mg total) by nebulization 4 (four) times daily as needed. For shortness of breath 120 mL 5  . Azelastine HCl (ASTEPRO) 0.15 % SOLN Place 2 puffs into the nose at bedtime.      . beclomethasone (QVAR) 80 MCG/ACT inhaler Inhale 2 puffs into the lungs 2 (two) times daily. 1 Inhaler 6  . cetirizine (ZYRTEC) 10 MG tablet Take 10 mg by mouth daily.      Marland Kitchen EPINEPHrine (EPI-PEN) 0.3 mg/0.3 mL SOAJ injection Inject 0.3 mLs (0.3 mg total) into the muscle once. 1 Device 0  . ferrous fumarate (HEMOCYTE - 106 MG FE) 325 (106 FE) MG TABS tablet Take 1 tablet by mouth daily.    Marland Kitchen guaiFENesin (MUCINEX) 600 MG 12 hr tablet Take 1,200 mg by mouth as needed.     Marland Kitchen levothyroxine (SYNTHROID, LEVOTHROID) 25 MCG tablet Take 25 mcg by mouth daily before breakfast.     . montelukast (SINGULAIR) 10 MG tablet Take 1 tablet (10 mg total) by mouth at bedtime. 30 tablet 6  . Multiple Vitamin (MULITIVITAMIN WITH MINERALS) TABS Take 1 tablet by mouth daily.    Marland Kitchen PRESCRIPTION MEDICATION Low dose estrogen for birth control    . RABEprazole (ACIPHEX) 20 MG tablet 20 mg 2 (two) times daily.    . benzonatate (TESSALON) 200 MG capsule Take 200 mg by mouth daily as needed.  1   No current facility-administered medications for this visit.  Review of Systems     Objective:   Physical Exam  Constitutional: She is oriented to person, place, and time. She appears well-developed and well-nourished. No distress.  HENT:  Head: Normocephalic and atraumatic.  Mouth/Throat: Oropharynx is clear and moist. No oropharyngeal exudate.  Eyes: Pupils are equal, round, and reactive to light. No scleral icterus.  Neck: Normal range of motion. Neck supple.  Cardiovascular: Normal rate, regular rhythm and normal heart sounds.   Pulmonary/Chest: Effort normal and breath sounds normal. No respiratory distress.  Abdominal: Soft. Bowel sounds are normal. She exhibits no distension. There is no  tenderness.  Musculoskeletal: She exhibits no edema.  Lymphadenopathy:    She has no cervical adenopathy.  Neurological: She is alert and oriented to person, place, and time.  NO FOCAL DEFICITS   Psychiatric:  SLIGHTLY ANXIOUS MOOD, NL AFFECT   Vitals reviewed.         Assessment & Plan:

## 2015-01-17 NOTE — Assessment & Plan Note (Signed)
DYSPEPSIA DUE TO UNCLEAR ETIOLOGY GASTRIC OR SMALL BOWEL PROCESS.  EGD FOR NEW ONSET DYSPEPSIA

## 2015-01-17 NOTE — Assessment & Plan Note (Signed)
ETIOLOGY UNKNOWN  GIVENS CAPSULE IN 3-4 WEEKS

## 2015-01-17 NOTE — Patient Instructions (Addendum)
DRINK WATER TO KEEP YOUR URINE LIGHT YELLOW.   USE FIBER POWDER OR 1 PACKET ONCE DAILY FOR 3 DAYS THEN TWICE DAILY FOR 3 DAYS THEN THREE TIMES A DAY. AVOID HIGHER DOSES IF IT CAUSES BLOATING & GAS.  COMPLETE UPPER ENDOSCOPY AND FLEX SIG WITH HEMORRHOID BANDING IN 3 WEEKS.

## 2015-01-17 NOTE — Progress Notes (Signed)
  PROCEDURE TECHNIQUE: BENEFITS RISK EXPLAINED TO PT. ANOSCOPY PERFORMED. BULGING INTERNAL HEMORRHOID COLUMN IN THE R POSTERIOR AND ANTERIOR COLUMNS and slightly in lef lateral position. ONE [facility] BAND PLACED IN RIGHT ANTERIOR POSITION. POST-BANDING RECTAL EXAM REVEALED GOOD PLACEMENT. Needed to manipulate band FOR PT TO BE PAIN FREE.EXAM NON-TENDER.

## 2015-01-17 NOTE — Assessment & Plan Note (Addendum)
DYSPEPSIA DUE TO UNCLEAR ETIOLOGY GASTRIC OR SMALL BOWEL PROCESS.  EGD FOR NEW ONSET DYSPEPSIA. DISCUSSED PROCEDURE, BENEFITS, & RISKS: < 1% chance of medication reaction, PELVIC VEIN SEPSIS, OR bleeding.

## 2015-01-18 ENCOUNTER — Ambulatory Visit (HOSPITAL_COMMUNITY)
Admission: RE | Admit: 2015-01-18 | Discharge: 2015-01-18 | Disposition: A | Discharge status: Home / Self Care | Payer: Managed Care, Other (non HMO) | Source: Ambulatory Visit | Attending: Gastroenterology | Admitting: Gastroenterology

## 2015-01-18 ENCOUNTER — Other Ambulatory Visit: Payer: Self-pay

## 2015-01-18 ENCOUNTER — Telehealth: Payer: Self-pay | Admitting: Gastroenterology

## 2015-01-18 DIAGNOSIS — R634 Abnormal weight loss: Secondary | ICD-10-CM | POA: Diagnosis not present

## 2015-01-18 DIAGNOSIS — K625 Hemorrhage of anus and rectum: Secondary | ICD-10-CM

## 2015-01-18 DIAGNOSIS — R109 Unspecified abdominal pain: Secondary | ICD-10-CM

## 2015-01-18 DIAGNOSIS — K119 Disease of salivary gland, unspecified: Secondary | ICD-10-CM

## 2015-01-18 DIAGNOSIS — R1013 Epigastric pain: Secondary | ICD-10-CM | POA: Insufficient documentation

## 2015-01-18 MED ORDER — IOHEXOL 300 MG/ML  SOLN
100.0000 mL | Freq: Once | INTRAMUSCULAR | Status: AC | PRN
  Administered 2015-01-18: 100 mL via INTRAVENOUS

## 2015-01-18 NOTE — Telephone Encounter (Signed)
Called patient MOBILE TO DISCUSS RESULTS. LVM-CT ABD/PELVIS IS NL. PT MAY CALL [phone] TO ASK QUESTIONS.

## 2015-01-18 NOTE — Telephone Encounter (Signed)
REVIEWED. AGREE. NO ADDITIONAL RECOMMENDATIONS. 

## 2015-01-18 NOTE — Telephone Encounter (Addendum)
PLEASE CALL PT. SHE NEEDS EGD/FLEX SIG -IH BANDING ON MAR 25 OR 28. SHE NEEDS A CT ABD/PELVIS TODAY Dx; ABDOMINAL PAIN, UNINTENTIONAL WEIGHT LOSS.

## 2015-01-18 NOTE — Telephone Encounter (Signed)
Noted  

## 2015-01-18 NOTE — Telephone Encounter (Signed)
PATIENT CALLED INQUIRING ABOUT WHEN SHE WILL HAVE HER UPPER GI, HAD BAD STOMACH PAIN LAST NIGHT AND WAS IN PAIN ALL NIGHT   [phone]

## 2015-01-18 NOTE — Telephone Encounter (Signed)
Pt is aware of everything [facility] said. She is going to the hospital now to have the CT scan done. The PA# for the CT scan is [id number]. She is also aware of the ENT appointment  At [facility] on 02/11/15 @ 1:30. I also going to set her up for EGD/FLEX SIG-IH BANDING on 02/11/15.

## 2015-01-18 NOTE — Telephone Encounter (Signed)
I called pt. She said she had a banding in the office yesterday. She had abdominal pain from about 8:30 pm til 4:00 AM this morning. It was more of an achy feeling. The pain/achiness has subsided. She has not had any nausea or vomiting. She had several small BM's yesterday. She said that Dr. Oneida Alar had mentioned possibly doing an EGD at some point and she wants to know if she advises it now. She is aware that Dr. Oneida Alar is at the hospital doing procedures. She she has worsening abdominal pain she will let us know.

## 2015-01-22 ENCOUNTER — Telehealth: Payer: Self-pay | Admitting: *Deleted

## 2015-01-22 NOTE — Telephone Encounter (Signed)
Called pt back and she is aware of appt. For ENT referral

## 2015-01-22 NOTE — Telephone Encounter (Signed)
Pt called today to follow up on the ENT referral. Please call her back at [phone]

## 2015-01-25 ENCOUNTER — Encounter (HOSPITAL_COMMUNITY): Payer: Self-pay | Admitting: Hematology & Oncology

## 2015-01-25 ENCOUNTER — Other Ambulatory Visit (HOSPITAL_COMMUNITY): Payer: Managed Care, Other (non HMO)

## 2015-01-25 ENCOUNTER — Encounter (HOSPITAL_COMMUNITY): Payer: Managed Care, Other (non HMO) | Attending: Hematology & Oncology | Admitting: Hematology & Oncology

## 2015-01-25 ENCOUNTER — Encounter (HOSPITAL_BASED_OUTPATIENT_CLINIC_OR_DEPARTMENT_OTHER): Payer: Managed Care, Other (non HMO)

## 2015-01-25 VITALS — BP 116/70 | HR 91 | Temp 98.1°F | Resp 18 | Wt 113.5 lb

## 2015-01-25 DIAGNOSIS — T454X5A Adverse effect of iron and its compounds, initial encounter: Secondary | ICD-10-CM | POA: Diagnosis not present

## 2015-01-25 DIAGNOSIS — D509 Iron deficiency anemia, unspecified: Secondary | ICD-10-CM

## 2015-01-25 DIAGNOSIS — D500 Iron deficiency anemia secondary to blood loss (chronic): Secondary | ICD-10-CM

## 2015-01-25 DIAGNOSIS — D508 Other iron deficiency anemias: Secondary | ICD-10-CM | POA: Diagnosis not present

## 2015-01-25 LAB — CBC WITH DIFFERENTIAL/PLATELET
Basophils Absolute: 0.1 10*3/uL (ref 0.0–0.1)
Basophils Relative: 1 % (ref 0–1)
EOS ABS: 0.1 10*3/uL (ref 0.0–0.7)
EOS PCT: 2 % (ref 0–5)
HCT: 38.1 % (ref 36.0–46.0)
HEMOGLOBIN: 12 g/dL (ref 12.0–15.0)
Lymphocytes Relative: 23 % (ref 12–46)
Lymphs Abs: 1.3 10*3/uL (ref 0.7–4.0)
MCH: 28.8 pg (ref 26.0–34.0)
MCHC: 31.5 g/dL (ref 30.0–36.0)
MCV: 91.4 fL (ref 78.0–100.0)
MONOS PCT: 13 % — AB (ref 3–12)
Monocytes Absolute: 0.7 10*3/uL (ref 0.1–1.0)
NEUTROS ABS: 3.4 10*3/uL (ref 1.7–7.7)
Neutrophils Relative %: 61 % (ref 43–77)
PLATELETS: 343 10*3/uL (ref 150–400)
RBC: 4.17 MIL/uL (ref 3.87–5.11)
RDW: 13.3 % (ref 11.5–15.5)
WBC: 5.5 10*3/uL (ref 4.0–10.5)

## 2015-01-25 LAB — COMPREHENSIVE METABOLIC PANEL
ALT: 23 U/L (ref 0–35)
AST: 22 U/L (ref 0–37)
Albumin: 4.2 g/dL (ref 3.5–5.2)
Alkaline Phosphatase: 54 U/L (ref 39–117)
Anion gap: 9 (ref 5–15)
BILIRUBIN TOTAL: 0.8 mg/dL (ref 0.3–1.2)
BUN: 16 mg/dL (ref 6–23)
CHLORIDE: 103 mmol/L (ref 96–112)
CO2: 27 mmol/L (ref 19–32)
CREATININE: 0.86 mg/dL (ref 0.50–1.10)
Calcium: 9.4 mg/dL (ref 8.4–10.5)
GFR calc Af Amer: >90 mL/min (ref >90)
GFR, EST NON AFRICAN AMERICAN: 82 mL/min — AB (ref >90)
GLUCOSE: 87 mg/dL (ref 70–99)
POTASSIUM: 3.6 mmol/L (ref 3.5–5.1)
Sodium: 139 mmol/L (ref 135–145)
Total Protein: 7.9 g/dL (ref 6.0–8.3)

## 2015-01-25 LAB — FERRITIN: Ferritin: 27 ng/mL (ref 10–291)

## 2015-01-25 NOTE — Progress Notes (Signed)
Lab Draw

## 2015-01-25 NOTE — Patient Instructions (Addendum)
[place] at [facility]  Discharge Instructions:  You saw Dr Whitney Muse today.  Follow up in 3 months with the doctor and lab work.  We will call you Monday to set you up for your iron infusion.  If you have any questions or concerns please call the clinic _______________________________________________________________  Thank you for choosing [place] at [facility] to provide your oncology and hematology care.  To afford each patient quality time with our providers, please arrive at least 15 minutes before your scheduled appointment.  You need to re-schedule your appointment if you arrive 10 or more minutes late.  We strive to give you quality time with our providers, and arriving late affects you and other patients whose appointments are after yours.  Also, if you no show three or more times for appointments you may be dismissed from the clinic.  Again, thank you for choosing [place] at [place] hope is that these requests will allow you access to exceptional care and in a timely manner. _______________________________________________________________  If you have questions after your visit, please contact our office at [phone] between the hours of 8:30 a.m. and 5:00 p.m. Voicemails left after 4:30 p.m. will not be returned until the following business day. _______________________________________________________________  For prescription refill requests, have your pharmacy contact our office. _______________________________________________________________  Recommendations made by the consultant and any test results will be sent to your referring physician. _______________________________________________________________

## 2015-02-04 ENCOUNTER — Other Ambulatory Visit (HOSPITAL_COMMUNITY): Payer: Self-pay | Admitting: Hematology & Oncology

## 2015-02-06 ENCOUNTER — Telehealth (HOSPITAL_COMMUNITY): Payer: Self-pay | Admitting: Hematology & Oncology

## 2015-02-06 NOTE — Telephone Encounter (Signed)
PER [facility] CSR NO AUTH IS REQUIRED FOR [id number] CALL REF# [id number]

## 2015-02-11 ENCOUNTER — Ambulatory Visit (HOSPITAL_COMMUNITY)
Admission: RE | Admit: 2015-02-11 | Discharge: 2015-02-11 | Disposition: A | Discharge status: Home / Self Care | Payer: Managed Care, Other (non HMO) | Source: Ambulatory Visit | Attending: Gastroenterology | Admitting: Gastroenterology

## 2015-02-11 ENCOUNTER — Encounter (HOSPITAL_COMMUNITY)
Admission: RE | Disposition: A | Discharge status: Home / Self Care | Payer: Self-pay | Source: Ambulatory Visit | Attending: Gastroenterology

## 2015-02-11 ENCOUNTER — Encounter (HOSPITAL_COMMUNITY): Payer: Self-pay | Admitting: *Deleted

## 2015-02-11 DIAGNOSIS — Z882 Allergy status to sulfonamides status: Secondary | ICD-10-CM | POA: Diagnosis not present

## 2015-02-11 DIAGNOSIS — R109 Unspecified abdominal pain: Secondary | ICD-10-CM | POA: Insufficient documentation

## 2015-02-11 DIAGNOSIS — R1013 Epigastric pain: Secondary | ICD-10-CM | POA: Diagnosis present

## 2015-02-11 DIAGNOSIS — K2950 Unspecified chronic gastritis without bleeding: Secondary | ICD-10-CM | POA: Insufficient documentation

## 2015-02-11 DIAGNOSIS — K648 Other hemorrhoids: Secondary | ICD-10-CM | POA: Diagnosis not present

## 2015-02-11 DIAGNOSIS — R634 Abnormal weight loss: Secondary | ICD-10-CM

## 2015-02-11 DIAGNOSIS — K625 Hemorrhage of anus and rectum: Secondary | ICD-10-CM | POA: Insufficient documentation

## 2015-02-11 HISTORY — PX: ESOPHAGOGASTRODUODENOSCOPY: SHX5428

## 2015-02-11 HISTORY — PX: FLEXIBLE SIGMOIDOSCOPY: SHX5431

## 2015-02-11 HISTORY — PX: HEMORRHOID BANDING: SHX5850

## 2015-02-11 SURGERY — SIGMOIDOSCOPY, FLEXIBLE
Anesthesia: Moderate Sedation

## 2015-02-11 MED ORDER — ONDANSETRON 4 MG PO TBDP
ORAL_TABLET | ORAL | Status: AC
  Filled 2015-02-11: qty 1

## 2015-02-11 MED ORDER — MEPERIDINE HCL 100 MG/ML IJ SOLN
INTRAMUSCULAR | Status: AC
  Filled 2015-02-11: qty 1

## 2015-02-11 MED ORDER — MIDAZOLAM HCL 5 MG/5ML IJ SOLN
INTRAMUSCULAR | Status: AC
  Filled 2015-02-11: qty 10

## 2015-02-11 MED ORDER — MEPERIDINE HCL 100 MG/ML IJ SOLN
INTRAMUSCULAR | Status: AC
  Filled 2015-02-11: qty 2

## 2015-02-11 MED ORDER — SODIUM CHLORIDE 0.9 % IV SOLN
INTRAVENOUS | Status: DC
  Administered 2015-02-11: 10:00:00 via INTRAVENOUS

## 2015-02-11 MED ORDER — MEPERIDINE HCL 100 MG/ML IJ SOLN
INTRAMUSCULAR | Status: DC | PRN
  Administered 2015-02-11 (×5): 25 mg via INTRAVENOUS

## 2015-02-11 MED ORDER — ONDANSETRON 4 MG PO TBDP
4.0000 mg | ORAL_TABLET | Freq: Once | ORAL | Status: AC
  Administered 2015-02-11: 4 mg via ORAL

## 2015-02-11 MED ORDER — MIDAZOLAM HCL 5 MG/5ML IJ SOLN
INTRAMUSCULAR | Status: DC | PRN
  Administered 2015-02-11 (×6): 1 mg via INTRAVENOUS

## 2015-02-11 MED ORDER — LIDOCAINE VISCOUS 2 % MT SOLN
OROMUCOSAL | Status: DC | PRN
  Administered 2015-02-11: 1 via OROMUCOSAL

## 2015-02-11 MED ORDER — ONDANSETRON HCL 4 MG PO TABS: 4.0000 mg | ORAL_TABLET | Freq: Once | ORAL | Status: DC

## 2015-02-11 MED ORDER — LIDOCAINE VISCOUS 2 % MT SOLN
OROMUCOSAL | Status: AC
  Filled 2015-02-11: qty 15

## 2015-02-11 MED ORDER — MIDAZOLAM HCL 5 MG/5ML IJ SOLN
INTRAMUSCULAR | Status: AC
  Filled 2015-02-11: qty 5

## 2015-02-11 NOTE — H&P (View-Only) (Signed)
Subjective:    Patient ID: [name] [name], female    DOB: 17-Aug-1971, 44 y.o.   MRN: [id number]  Delman Cheadle, PA-C  HPI NOW WORKING OUT AS MUCH. WAS HAVING A LOT OF ABDOMINAL PAIN. WASN'T REALLY TAKING ANY SUPPLEMENTS. CAME ON SUDDENLY IN DEC 2015 IN THE EPIGASTRIUM AND NAUSEA AND LIKE IT WAS LOCKED UP. ACHY AND DRIVING. KEPT HER UP AT NIGHT BUT NO DIARRHEA. FREQUENT FORMED SMALL/NL) CALIBER(2-3 AT NIGHT). JUST WOKE UP ONE AM IT WAS GONE SX LASTED FOR 3-4 DAYS AND LINGERED FOR 2-3 WEEKS. ASSOCIATED WITH T 99-110f/CHILLS OFF AND ON. RARE BRBPR, NAUSEA-CONSTANT DURING SEVERE EPISODE. BMs: ONCE DAILY. CAN INCOMPLETELY EVACUATE. DOES HAVE CONSTIPATION. LAST ABD PAIN: 2-3 MILD EPISODE AND THEY WOULD LAST COUPLE HOURS. MOUTH WAS ACTING UP(FOR YEARS-WORSE SINCE DEC 2015-BURNING ALL THE TIME).  PT DENIES HEMATOCHEZIA, vomiting, melena, diarrhea, CHEST PAIN, SHORTNESS OF BREATH,  problems swallowing, OR problems with sedation, heartburn or indigestion.  Past Medical History  Diagnosis Date  . Chronic rhinosinusitis   . Allergic rhinitis   . Cough   . Cardiac murmur   . HSV infection     History of-outbreak during pregnancy 2004  . Mono exposure     Had in January  . Thrush, oral   . Yeast infection of the vagina   . Mononucleosis 01/06/2012   Past Surgical History  Procedure Laterality Date  . Cesarean section  2002,2004,2008, 2009  . Nasal sinus surgery  2005  . Colonoscopy  12/25/2011    Dr. Oneida Alar: small internal hemorrhoids, adenomas, surveillance in 2018  . Esophagogastroduodenoscopy  Feb 2013    Dr. Oneida Alar: normal stomach, duodenum, esophagus. Gastritis. hiatal hernia   Allergies  Allergen Reactions  . Sulfonamide Derivatives Anaphylaxis   Current Outpatient Prescriptions  Medication Sig Dispense Refill  . albuterol (PROVENTIL HFA) 108 (90 BASE) MCG/ACT inhaler Inhale 2 puffs into the lungs 4 (four) times daily as needed. For shortness of breath    . albuterol (PROVENTIL)  (2.5 MG/3ML) 0.083% nebulizer solution Take 3 mLs (2.5 mg total) by nebulization 4 (four) times daily as needed. For shortness of breath 120 mL 5  . Azelastine HCl (ASTEPRO) 0.15 % SOLN Place 2 puffs into the nose at bedtime.      . beclomethasone (QVAR) 80 MCG/ACT inhaler Inhale 2 puffs into the lungs 2 (two) times daily. 1 Inhaler 6  . cetirizine (ZYRTEC) 10 MG tablet Take 10 mg by mouth daily.      Marland Kitchen EPINEPHrine (EPI-PEN) 0.3 mg/0.3 mL SOAJ injection Inject 0.3 mLs (0.3 mg total) into the muscle once. 1 Device 0  . ferrous fumarate (HEMOCYTE - 106 MG FE) 325 (106 FE) MG TABS tablet Take 1 tablet by mouth daily.    Marland Kitchen guaiFENesin (MUCINEX) 600 MG 12 hr tablet Take 1,200 mg by mouth as needed.     Marland Kitchen levothyroxine (SYNTHROID, LEVOTHROID) 25 MCG tablet Take 25 mcg by mouth daily before breakfast.     . montelukast (SINGULAIR) 10 MG tablet Take 1 tablet (10 mg total) by mouth at bedtime. 30 tablet 6  . Multiple Vitamin (MULITIVITAMIN WITH MINERALS) TABS Take 1 tablet by mouth daily.    Marland Kitchen PRESCRIPTION MEDICATION Low dose estrogen for birth control    . RABEprazole (ACIPHEX) 20 MG tablet 20 mg 2 (two) times daily.    . benzonatate (TESSALON) 200 MG capsule Take 200 mg by mouth daily as needed.  1   No current facility-administered medications for this visit.  Review of Systems     Objective:   Physical Exam  Constitutional: She is oriented to person, place, and time. She appears well-developed and well-nourished. No distress.  HENT:  Head: Normocephalic and atraumatic.  Mouth/Throat: Oropharynx is clear and moist. No oropharyngeal exudate.  Eyes: Pupils are equal, round, and reactive to light. No scleral icterus.  Neck: Normal range of motion. Neck supple.  Cardiovascular: Normal rate, regular rhythm and normal heart sounds.   Pulmonary/Chest: Effort normal and breath sounds normal. No respiratory distress.  Abdominal: Soft. Bowel sounds are normal. She exhibits no distension. There is no  tenderness.  Musculoskeletal: She exhibits no edema.  Lymphadenopathy:    She has no cervical adenopathy.  Neurological: She is alert and oriented to person, place, and time.  NO FOCAL DEFICITS   Psychiatric:  SLIGHTLY ANXIOUS MOOD, NL AFFECT   Vitals reviewed.         Assessment & Plan:

## 2015-02-11 NOTE — Op Note (Signed)
[facility] [address] [place], [address]   FLEX SIGMOIDOSCOPY PROCEDURE REPORT  PATIENT: [name] [name], [name] [name]  MR#: [id number] BIRTHDATE: [birth year] , 37  yrs. old GENDER: female ENDOSCOPIST: Danie Binder, MD REFERRED QR:[id number] Glennon Mac, P.A. PROCEDURE DATE:  February 21, 2015 PROCEDURE:   Hemorrhoidectomy via banding(3) and Sigmoidoscopy, diagnostic INDICATIONS:RECTAL PAIN >>> RECTAL BLEEDING. MEDICATIONS: Versed 1 mg IV and Demerol 25 mg IV  DESCRIPTION OF PROCEDURE:    Physical exam was performed.  Informed consent was obtained from the patient after explaining the benefits, risks, and alternatives to procedure.  The patient was connected to monitor and placed in left lateral position. Continuous oxygen was provided by nasal cannula and IV medicine administered through an indwelling cannula.  After administration of sedation and rectal exam, the patients rectum was intubated and the EC-[id number] ([id number])  colonoscope was advanced under direct visualization to the [place]. The scope was removed slowly by carefully examining the color, texture, anatomy, and integrity mucosa on the way out.  The patient was recovered in endoscopy and discharged home in satisfactory condition.    COLON FINDINGS: The colon mucosa was otherwise normal and Moderate sized internal hemorrhoids were found. 3 BANDS APPLIED  PREP QUALITY: The overall prep quality was adequate. COMPLICATIONS: None-D/C W/O RECTAL PAIN/MILD PRESSURE  ENDOSCOPIC IMPRESSION: 1.   The colon mucosa was otherwise normal 2.   Moderate sized internal hemorrhoids  RECOMMENDATIONS: 1.  May use ibuprofen 200 MG over-the-counter 2 every 6 hours as needed for mild rectal pain OR TYLENOL.   Take the ibuprofen with food and milk. 2  Use sitz bath 3 times a day AS NEEDED FOR RECTAL PAIN. 3.  Avoid straining to have bowel movements. 4.  Keep anal area dry and clean. 5.  Do not use a donut shaped pillow or sit on the  toilet for long periods. 6.  Move bowels when your body has the urge. 7.  IF NEEDED add Colace 100 mg twice daily FOR 7 DAYS to soften the stool.  HOLD FOR DIARRHEA. 8.  FOLLOW A LOW RESIDUE DIET FOR 2 WEEKS. [place]. 10.  CONTINUE FIBER POWDER.   eSigned:  Danie Binder, MD 02-21-2015 2:22 PM   CPT CODES: ICD CODES:  The ICD and CPT codes recommended by this software are interpretations from the data that the clinical staff has captured with the software.  The verification of the translation of this report to the ICD and CPT codes and modifiers is the sole responsibility of the health care institution and practicing physician where this report was generated.  [employer] will not be held responsible for the validity of the ICD and CPT codes included on this report.  AMA assumes no liability for data contained or not contained herein. CPT is a Designer, television/film set of the [employer].

## 2015-02-11 NOTE — Discharge Instructions (Signed)
YOUR UPPER ENDOSCOPY SHOWED GASTRITIS. I BIOPSIED YOUR STOMACH AND SMALL BOWEL.      HEMORRHOIDAL BANDING DISCHARGE INSTRUCTIONS  I PUT BANDS IN 3 AREAS ABOVE YOUR HEMORRHOIDS. CALL [phone] FOR FOR RECTAL BLEEDING, FEVER, PAIN, OR DIFFICULTY URINATING. YOU MAY SEE BLEEDING FOR 3 DAYS TO 2 WEEKS.    HOME CARE INSTRUCTIONS  FOLLOWING YOUR PROCEDURE IT IS COMMON TO HAVE MINOR RECTAL DISCOMFORT OR PAIN. 1.  YOU may use ibuprofen 200 MG over-the-counter 2 every 6 hours as needed for mild rectal pain OR TYLENOL.   Take the ibuprofen with food and milk.  2   Use sitz bath 3 times a day AS NEEDED FOR RECTAL PAIN.  3. Avoid straining to have bowel movements. 4. Keep anal area dry and clean.  5. Do not use a donut shaped pillow or sit on the toilet for long periods. This increases blood pooling and pain.  6. Move your bowels when your body has the urge; this will require less straining and will decrease pain and pressure.  7. IF NEEDED add Colace 100 mg twice daily FOR 7 DAYS to soften the stool. HOLD FOR DIARRHEA. 8. FOLLOW A LOW RESIDUE DIET FOR 2 WEEKS. SEE INFO BELOW. [place]. 10. CONTINUE FIBER POWDER.      SIGMOIDOSCOPY Care After Read the instructions outlined below and refer to this sheet in the next week. These discharge instructions provide you with general information on caring for yourself after you leave the hospital. While your treatment has been planned according to the most current medical practices available, unavoidable complications occasionally occur. If you have any problems or questions after discharge, call DR. Ndrew Creason, [phone].  ACTIVITY  You may resume your regular activity, but move at a slower pace for the next 24 hours.   Take frequent rest periods for the next 24 hours.   Walking will help get rid of the air and reduce the bloated feeling in your belly (abdomen).   No driving for 24 hours (because of the medicine (anesthesia) used during  the test).   You may shower.   Do not sign any important legal documents or operate any machinery for 24 hours (because of the anesthesia used during the test).    NUTRITION  Drink plenty of fluids.   You may resume your normal diet as instructed by your doctor.   Begin with a light meal and progress to your normal diet. Heavy or fried foods are harder to digest and may make you feel sick to your stomach (nauseated).   Avoid alcoholic beverages for 24 hours or as instructed.    MEDICATIONS  You may resume your normal medications.   WHAT YOU CAN EXPECT TODAY  Some feelings of bloating in the abdomen.   Passage of more gas than usual.   Spotting of blood in your stool or on the toilet paper  .  IF YOU HAD POLYPS REMOVED DURING THE COLONOSCOPY:  Eat a soft diet IF YOU HAVE NAUSEA, BLOATING, ABDOMINAL PAIN, OR VOMITING.    FINDING OUT THE RESULTS OF YOUR TEST Not all test results are available during your visit. DR. Oneida Alar WILL CALL YOU WITHIN 14 DAYS OF YOUR PROCEDUE WITH YOUR RESULTS. Do not assume everything is normal if you have not heard from DR. Yaden Seith, CALL HER OFFICE AT [phone].  SEEK IMMEDIATE MEDICAL ATTENTION AND CALL THE OFFICE: [phone] IF:  You have more than a spotting of blood in your stool.   Your belly is swollen (  abdominal distention).   You are nauseated or vomiting.   You have a temperature over 101F.   You have abdominal pain or discomfort that is severe or gets worse throughout the day.    HEMORRHOIDAL BANDING COMPLICATIONS:  COMMON: 1. MINOR PAIN  UNCOMMON: 1. ABSCESS 2. BAND FALLS OFF 3. PROLAPSE OF HEMORRHOIDS AND PAIN 4. ULCER BLEEDING  A. USUALLY SELF-LIMITED: MAY LAST 3-5 DAYS  B. MAY REQUIRE INTERVENTION: 1-2 WEEKS AFTER INTERACTIONS 5. NECROTIZING PELVIC SEPSIS  A. SYMPTOMS: FEVER, PAIN, DIFFICULTY URINATING  Low Fiber and Residue Restricted Diet A low fiber diet restricts foods that contain carbohydrates that  are not digested in the small intestine. A diet containing about 10 g of fiber is considered low fiber. The diet needs to be individualized to suit patient tolerances and preferences and to avoid unnecessary restrictions. Generally, the foods emphasized in a low fiber diet have no skins or seeds. They may have been processed to remove bran, germ, or husks. Cooking may not necessarily eliminate the fiber. Cooking may, in fact, enable a greater quantity of fiber to be consumed in a lesser volume. Legumes and nuts are also restricted. The term low residue has also been used to describe low fiber diets, although the two are not the same. Residue refers to any substance that adds to bowel (colonic) contents, such as sloughed cells and intestinal bacteria, in addition to fiber. Residue-containing foods, prunes and prune juice, milk, and connective tissue from meats may also need to be eliminated. It is important to eliminate these foods during sudden (acute) attacks of inflammatory bowel disease, when there is a partial obstruction due to another reason, or when minimal fecal output is desired. When these problems are gone, a more normal diet may be used.  PURPOSE  Reduce stool weight and volume.   CHOOSING FOODS Check labels, especially on foods from the starch list. Often, dietary fiber content is listed with the Nutrition Facts panel.  Breads and Starches  Allowed: White, [place], and pita breads, plain rolls, buns, or sweet rolls, doughnuts, waffles, pancakes, bagels. Plain muffins, sweet breads, biscuits, matzoth. Flour. Soda, saltine, or graham crackers. Pretzels, rusks, melba toast, zwieback. Cooked cereals: cornmeal, far[place], cream cereals. Dry cereals: refined corn, wheat, rice, and oat cereals (check label). Potatoes prepared any way without skins, refined macaroni, spaghetti, noodles, refined rice.   Avoid: Bread, rolls, or crackers made with whole-wheat, multigrains, rye, bran seeds, nuts, or  coconut. Corn tortillas, table-shells. Corn chips, tortilla chips. Cereals containing whole-grains, multigrains, bran, coconut, nuts, or raisins. Cooked or dry oatmeal. Coarse wheat cereals, granola. Cereals advertised as "high fiber." Potato skins. Whole-grain pasta, wild or brown rice. Popcorn.  Vegetables  Allowed:  Strained tomato and vegetable juices. Fresh: tender lettuce, cucumber, cabbage, spinach, bean sprouts. Cooked, canned: asparagus, bean sprouts, cut green or wax beans, cauliflower, pumpkin, beets, mushrooms, olives, spinach, yellow squash, tomato, tomato sauce (no seeds), zucchini (peeled), turnips. Canned sweet potatoes. Small amounts of celery, onion, radish, and green pepper may be used. Keep servings limited to  cup.   Avoid: Fresh, cooked, or canned: artichokes, baked beans, beet greens, broccoli, Brussels sprouts, French-style green beans, corn, kale, legumes, peas, sweet potatoes. Cooked: green or red cabbage, spinach. Avoid large servings of any vegetables.  Fruit  Allowed:  All fruit juices except prune juice. Cooked or canned: apricots applesauce, cantaloupe, cherries, grapefruit, grapes, kiwi, mandarin oranges, peaches, pears, fruit cocktail, pineapple, plums, watermelon. Fresh: banana, grapes, cantaloupe, avocado, cherries, pineapple, grapefruit, kiwi, nectarines, peaches,  oranges, blueberries, plums. Keep servings limited to  cup or 1 piece.   Avoid: Fresh: apple with or without skin, apricots, mango, pears, raspberries, strawberries. Prune juice, stewed or dried prunes. Dried fruits, raisins, dates. Avoid large servings of all fresh fruits.  Meat and Meat Substitutes  Allowed:  Ground or well-cooked tender beef, ham, veal, lamb, pork, or poultry. Eggs, plain cheese. Fish, oysters, shrimp, lobster, other seafood. Liver, organ meats.   Avoid: Tough, fibrous meats with gristle. Peanut butter, smooth or chunky. Cheese with seeds, nuts, or other foods not allowed. Nuts,  seeds, legumes, dried peas, beans, lentils.  Milk  Allowed:  All milk products except those not allowed. Milk and milk product consumption should be minimal when low residue is desired.   Avoid: Yogurt that contains nuts or seeds.  Soups and Combination Foods  Allowed:  Bouillon, broth, or cream soups made from allowed foods. Any strained soup. Casseroles or mixed dishes made with allowed foods.   Avoid: Soups made from vegetables that are not allowed or that contain other foods not allowed.  Desserts and Sweets  Allowed:  Plain cakes and cookies, pie made with allowed fruit, pudding, custard, cream pie. Gelatin, fruit, ice, sherbet, frozen ice pops. Ice cream, ice milk without nuts. Plain hard candy, honey, jelly, molasses, syrup, sugar, chocolate syrup, gumdrops, marshmallows.   Avoid: Desserts, cookies, or candies that contain nuts, peanut butter, or dried fruits. Jams, preserves with seeds, marmalade.  Fats and Oils  Allowed:  Margarine, butter, cream, mayonnaise, salad oils, plain salad dressings made from allowed foods. Plain gravy, crisp bacon without rind.   Avoid: Seeds, nuts, olives. Avocados.  Beverages  Allowed:  All, except those listed to avoid.   Avoid: Fruit juices with high pulp, prune juice.  Condiments  Allowed:  Ketchup, mustard, horseradish, vinegar, cream sauce, cheese sauce, cocoa powder. Spices in moderation: allspice, basil, bay leaves, celery powder or leaves, cinnamon, cumin powder, curry powder, ginger, mace, marjoram, onion or garlic powder, oregano, paprika, parsley flakes, ground pepper, rosemary, sage, savory, tarragon, thyme, turmeric.   Avoid: Coconut, pickles.  SAMPLE MEAL PLAN The following menu is provided as a sample. Your daily menu plans will vary. Be sure to include a minimum of the following each day in order to provide essential nutrients for the adult:  Starch/Bread/Cereal Group, 6 servings.   Fruit/Vegetable Group, 5 servings.    Meat/Meat Substitute Group, 2 servings.   Milk/Milk Substitute Group, 2 servings.  A serving is equal to  cup for fruits, vegetables, and cooked cereals or 1 piece for foods such as a piece of bread, 1 orange, or 1 apple. For dry cereals and crackers, use serving sizes listed on the label. Combination foods may count as full or partial servings from various food groups. Fats, desserts, and sweets may be added to the meal plan after the requirements for essential nutrients are met. SAMPLE MENU Breakfast   cup orange juice.   1 boiled egg.   1 slice white toast.   Margarine.    cup cornflakes.   1 cup milk.   Beverage.  Lunch   cup chicken noodle soup.   2 to 3 oz sliced roast beef.   2 slices seedless rye bread.   Mayonnaise.    cup tomato juice.   1 small banana.   Beverage.  Dinner  3 oz baked chicken.    cup scalloped potatoes.    cup cooked beets.   White dinner roll.   Margarine.  cup canned peaches.   Beverage.

## 2015-02-11 NOTE — Op Note (Signed)
[facility] [address] [place], [address]   ENDOSCOPY PROCEDURE REPORT  PATIENT: [name] [name], [name] [name]  MR#: [id number] BIRTHDATE: [birth year] , 89  yrs. old GENDER: female  ENDOSCOPIST: Danie Binder, MD REFERRED [id number] Glennon Mac, P.A. PROCEDURE DATE: 2015-03-11 PROCEDURE:   EGD w/ biopsy  INDICATIONS:epigastric pain.   dyspepsia. SYMPTOMS IMPROVED BUT NOT RESOLVED ON ACIPHEX. MOTHER OF 4 KIDS. 2 WANT TO BE A DOCTOR. MEDICATIONS: Demerol 100 mg IV and Versed 5 mg IV TOPICAL ANESTHETIC:   Viscous Xylocaine ASA CLASS:  DESCRIPTION OF PROCEDURE:     Physical exam was performed.  Informed consent was obtained from the patient after explaining the benefits, risks, and alternatives to the procedure.  The patient was connected to the monitor and placed in the left lateral position.  Continuous oxygen was provided by nasal cannula and IV medicine administered through an indwelling cannula.  After administration of sedation, the patients esophagus was intubated and the [id number] ([id number])  endoscope was advanced under direct visualization to the second portion of the duodenum.  The scope was removed slowly by carefully examining the color, texture, anatomy, and integrity of the mucosa on the way out.  The patient was recovered in endoscopy and discharged home in satisfactory condition.   ESOPHAGUS: The mucosa of the esophagus appeared normal.   STOMACH: Mild non-erosive gastritis (inflammation) was found in the gastric antrum.  Multiple biopsies were performed using cold forceps. DUODENUM: The duodenal mucosa showed no abnormalities in the bulb and 2nd part of the duodenum.  Cold forceps biopsies were taken in the bulb and second portion. COMPLICATIONS: There were no immediate complications.  ENDOSCOPIC IMPRESSION: 1.   NO OBVIOUS SOURCE FOR EPIGASTRIC PAIN IDENTIFIED 2.   MILD Non-erosive gastritis   RECOMMENDATIONS: ACIPHEX DAILY LOW FAT DIET AWAIT  BIOPSY OPV IN JUN 2016  REPEAT EXAM: eSigned:  Danie Binder, MD 03-11-2015 2:18 PM   CPT CODES: ICD CODES:  The ICD and CPT codes recommended by this software are interpretations from the data that the clinical staff has captured with the software.  The verification of the translation of this report to the ICD and CPT codes and modifiers is the sole responsibility of the health care institution and practicing physician where this report was generated.  [employer] will not be held responsible for the validity of the ICD and CPT codes included on this report.  AMA assumes no liability for data contained or not contained herein. CPT is a Designer, television/film set of the [employer].

## 2015-02-11 NOTE — Interval H&P Note (Signed)
History and Physical Interval Note:  02/11/2015 10:58 AM  [name] [name]  has presented today for surgery, with the diagnosis of DYSPEPSIA/RECTAL BLEEDING  The various methods of treatment have been discussed with the patient and family. After consideration of risks, benefits and other options for treatment, the patient has consented to  Procedure(s) with comments: FLEXIBLE SIGMOIDOSCOPY (N/A) - 1045 ESOPHAGOGASTRODUODENOSCOPY (EGD) (N/A) HEMORRHOID BANDING (N/A) as a surgical intervention .  The patient's history has been reviewed, patient examined, no change in status, stable for surgery.  I have reviewed the patient's chart and labs.  Questions were answered to the patient's satisfaction.     [employer]

## 2015-02-12 ENCOUNTER — Encounter (HOSPITAL_BASED_OUTPATIENT_CLINIC_OR_DEPARTMENT_OTHER): Payer: Managed Care, Other (non HMO)

## 2015-02-12 ENCOUNTER — Encounter (HOSPITAL_COMMUNITY): Payer: Self-pay | Admitting: Gastroenterology

## 2015-02-12 DIAGNOSIS — D500 Iron deficiency anemia secondary to blood loss (chronic): Secondary | ICD-10-CM

## 2015-02-12 DIAGNOSIS — D508 Other iron deficiency anemias: Secondary | ICD-10-CM

## 2015-02-12 DIAGNOSIS — D509 Iron deficiency anemia, unspecified: Secondary | ICD-10-CM

## 2015-02-12 DIAGNOSIS — D126 Benign neoplasm of colon, unspecified: Secondary | ICD-10-CM

## 2015-02-12 MED ORDER — SODIUM CHLORIDE 0.9 % IJ SOLN
250.0000 mg | Freq: Once | INTRAMUSCULAR | Status: DC
Start: 2015-02-12 — End: 2015-02-12
  Filled 2015-02-12: qty 20

## 2015-02-12 MED ORDER — NA FERRIC GLUC CPLX IN SUCROSE 12.5 MG/ML IV SOLN
250.0000 mg | Freq: Once | INTRAVENOUS | Status: AC
  Administered 2015-02-12: 250 mg via INTRAVENOUS
  Filled 2015-02-12: qty 20

## 2015-02-12 MED ORDER — SODIUM CHLORIDE 0.9 % IV SOLN: Freq: Once | INTRAVENOUS | Status: DC

## 2015-02-12 NOTE — Patient Instructions (Signed)
[place] [facility] Discharge Instructions  RECOMMENDATIONS MADE BY THE CONSULTANT AND ANY TEST RESULTS WILL BE SENT TO YOUR REFERRING PHYSICIAN.  Today you received iron infusion.  Return as scheduled for future infusions, lab work, and office visits. Feel free to call the clinic with any questions or concerns.  Thank you for choosing [place] [facility] to provide your oncology and hematology care.  To afford each patient quality time with our provider, please arrive at least 15 minutes before your scheduled appointment time.    You need to re-schedule your appointment should you arrive 10 or more minutes late.  We strive to give you quality time with our providers, and arriving late affects you and other patients whose appointments are after yours.  Also, if you no show three or more times for appointments you may be dismissed from the clinic at the providers discretion.     Again, thank you for choosing [facility].  Our hope is that these requests will decrease the amount of time that you wait before being seen by our physicians.       _____________________________________________________________  Should you have questions after your visit to [facility], please contact our office at [phone] between the hours of 8:30 a.m. and 4:30 p.m.  Voicemails left after 4:30 p.m. will not be returned until the following business day.  For prescription refill requests, have your pharmacy contact our office.

## 2015-02-18 NOTE — Progress Notes (Signed)
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 female returns for follow-up of severe iron deficiency related to heavy menstrual cycles. She states she has been started on oral contraceptives again and her periods are improved. She has 3 heavy days and to light days. She notices however that she is feeling fatigued, more than her baseline and wonders if she is iron deficient again. She had a colonoscopy in 2013. She was on iron in the past but states she could not tolerate oral iron pills.  MEDICAL HISTORY: Past Medical History  Diagnosis Date  . Chronic rhinosinusitis   . Allergic rhinitis   . Cough   . Cardiac murmur   . HSV infection     History of-outbreak during pregnancy 2004  . Mono exposure     Had in January  . Thrush, oral   . Yeast infection of the vagina   . Mononucleosis 01/06/2012    has SINUSITIS, ACUTE; RHINOSINUSITIS, RECURRENT; Seasonal and perennial allergic rhinitis; CARDIAC MURMUR; COUGH; Thrush; Allergic-infective asthma; Dysphagia; GERD (gastroesophageal reflux disease); Anemia; Family history of colonic polyps; Mononucleosis; Colon adenomas; Dysphagia; Fatigue; Abdominal pain, epigastric; IDA (iron deficiency anemia); Anal or rectal pain; Dry mouth; Rectal bleeding; and AP (abdominal pain) on her problem list.     is allergic to sulfonamide derivatives.  [name] [name] had no medications administered during this visit.  SURGICAL HISTORY: Past Surgical History  Procedure Laterality Date  . Cesarean section  2002,2004,2008, 2009  . Nasal sinus surgery  2005  . Colonoscopy  12/25/2011    Dr. Oneida [name]: small internal hemorrhoids, adenomas, surveillance in 2018  . Esophagogastroduodenoscopy  Feb 2013    Dr. Oneida [name]: normal stomach, duodenum, esophagus. Gastritis. hiatal hernia  .  Flexible sigmoidoscopy N/A 02/11/2015    Procedure: FLEXIBLE SIGMOIDOSCOPY;  Surgeon: [name] Binder, MD;  [facility]: [facility];  Service: Endoscopy;  Laterality: N/A;  1045  . Esophagogastroduodenoscopy N/A 02/11/2015    Procedure: ESOPHAGOGASTRODUODENOSCOPY (EGD);  Surgeon: [name] Binder, MD;  Location: [facility];  Service: Endoscopy;  Laterality: N/A;  . Hemorrhoid banding N/A 02/11/2015    Procedure: HEMORRHOID BANDING;  Surgeon: [name] Binder, MD;  Location: [facility];  Service: Endoscopy;  Laterality: N/A;    SOCIAL HISTORY: History   Social History  . Marital Status: Married    Spouse Name: N/A  . Number of Children: 4  . Years of Education: N/A   Occupational History  . Housewife-recent accounting degree.    Social History Main Topics  . Smoking status: Never Smoker   . Smokeless tobacco: Never Used  . Alcohol Use: No  . Drug Use: No  . Sexual Activity: Yes   Other Topics Concern  . Not on file   Social History Narrative    FAMILY HISTORY: Family History  Problem Relation Age of Onset  . Allergies Sister   . Allergies Brother   . Cervical cancer Mother   . Colon polyps Mother 26    several "benign" polyps  . Cancer Mother   . Breast cancer      grandmother  . Colon cancer Neg Hx   . Cancer Maternal Grandmother     Review of Systems  Constitutional: Positive for malaise/fatigue. Negative for fever, chills and weight loss.  HENT: Negative for congestion, hearing loss, nosebleeds, sore  throat and tinnitus.   Eyes: Negative for blurred vision, double vision, pain and discharge.  Respiratory: Negative for cough, hemoptysis, sputum production, shortness of breath and wheezing.   Cardiovascular: Negative for chest pain, palpitations, claudication, leg swelling and PND.  Gastrointestinal: Negative for heartburn, nausea, vomiting, abdominal pain, diarrhea, constipation, blood in stool and melena.  Genitourinary: Negative for dysuria, urgency, frequency  and hematuria.  Musculoskeletal: Negative for myalgias, joint pain and falls.  Skin: Negative for itching and rash.  Neurological: Negative for dizziness, tingling, tremors, sensory change, speech change, focal weakness, seizures, loss of consciousness, weakness and headaches.  Endo/Heme/Allergies: Does not bruise/bleed easily.  Psychiatric/Behavioral: Negative for depression, suicidal ideas, memory loss and substance abuse. The patient is not nervous/anxious and does not have insomnia.     PHYSICAL EXAMINATION  ECOG PERFORMANCE STATUS: 0 - Asymptomatic  Filed Vitals:   01/25/15 1000  BP: 116/70  Pulse:   Temp:   Resp:     Physical Exam  Constitutional: She is oriented to person, place, and time and well-developed, well-nourished, and in no distress.  HENT:  Head: Normocephalic and atraumatic.  Nose: Nose normal.  Mouth/Throat: Oropharynx is clear and moist. No oropharyngeal exudate.  Eyes: Conjunctivae and EOM are normal. Pupils are equal, round, and reactive to light. Right eye exhibits no discharge. Left eye exhibits no discharge. No scleral icterus.  Neck: Normal range of motion. Neck supple. No tracheal deviation present. No thyromegaly present.  Cardiovascular: Normal rate, regular rhythm and normal heart sounds.  Exam reveals no gallop and no friction rub.   No murmur heard. Pulmonary/Chest: Effort normal and breath sounds normal. She has no wheezes. She has no rales.  Abdominal: Soft. Bowel sounds are normal. She exhibits no distension and no mass. There is no tenderness. There is no rebound and no guarding.  Musculoskeletal: Normal range of motion. She exhibits no edema.  Lymphadenopathy:    She has no cervical adenopathy.  Neurological: She is alert and oriented to person, place, and time. She has normal reflexes. No cranial nerve deficit. Gait normal. Coordination normal.  Skin: Skin is warm and dry. No rash noted.  Psychiatric: Mood, memory, affect and judgment  normal.  Nursing note and vitals reviewed.   LABORATORY DATA:  CBC    Component Value Date/Time   WBC 5.5 01/25/2015 0930   RBC 4.17 01/25/2015 0930   RBC 3.97 12/05/2013 1500   HGB 12.0 01/25/2015 0930   HCT 38.1 01/25/2015 0930   PLT 343 01/25/2015 0930   MCV 91.4 01/25/2015 0930   MCH 28.8 01/25/2015 0930   MCHC 31.5 01/25/2015 0930   RDW 13.3 01/25/2015 0930   LYMPHSABS 1.3 01/25/2015 0930   MONOABS 0.7 01/25/2015 0930   EOSABS 0.1 01/25/2015 0930   BASOSABS 0.1 01/25/2015 0930   CMP     Component Value Date/Time   NA 139 01/25/2015 0930   K 3.6 01/25/2015 0930   CL 103 01/25/2015 0930   CO2 27 01/25/2015 0930   GLUCOSE 87 01/25/2015 0930   BUN 16 01/25/2015 0930   CREATININE 0.86 01/25/2015 0930   CREATININE 0.72 12/25/2014 1005   CALCIUM 9.4 01/25/2015 0930   PROT 7.9 01/25/2015 0930   ALBUMIN 4.2 01/25/2015 0930   AST 22 01/25/2015 0930   ALT 23 01/25/2015 0930   ALKPHOS 54 01/25/2015 0930   BILITOT 0.8 01/25/2015 0930   GFRNONAA 82* 01/25/2015 0930   GFRAA >90 01/25/2015 0930      ASSESSMENT and THERAPY PLAN:  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 She had questions as to whether or not she could follow with [place] on a more regular basis, rather than just once a year. I advised her I would like to proceed as follows; I will call her if she needs additional iron replacement and we will arrange that for her. We will then schedule her for 3 month visit with repeat laboratory studies and reassess her labs including iron stores at that time. We will be able to get a better idea of how often she may need to be seen and how often she may need ongoing iron replacement to keep her energy level and blood counts adequate.  All questions were answered. The patient knows to call the clinic with any problems, questions or concerns. We can certainly see the  patient much sooner if necessary. This note was electronically signed. Molli Hazard  MD 02/18/2015

## 2015-02-19 ENCOUNTER — Encounter (HOSPITAL_COMMUNITY): Payer: Self-pay

## 2015-02-19 ENCOUNTER — Encounter (HOSPITAL_COMMUNITY): Payer: Managed Care, Other (non HMO) | Attending: Hematology & Oncology

## 2015-02-19 DIAGNOSIS — D126 Benign neoplasm of colon, unspecified: Secondary | ICD-10-CM

## 2015-02-19 DIAGNOSIS — D509 Iron deficiency anemia, unspecified: Secondary | ICD-10-CM | POA: Diagnosis not present

## 2015-02-19 DIAGNOSIS — D500 Iron deficiency anemia secondary to blood loss (chronic): Secondary | ICD-10-CM

## 2015-02-19 DIAGNOSIS — D508 Other iron deficiency anemias: Secondary | ICD-10-CM | POA: Insufficient documentation

## 2015-02-19 MED ORDER — SODIUM CHLORIDE 0.9 % IV SOLN
Freq: Once | INTRAVENOUS | Status: AC
Start: 2015-02-19 — End: 2015-02-19
  Administered 2015-02-19: 15:00:00 via INTRAVENOUS

## 2015-02-19 MED ORDER — SODIUM CHLORIDE 0.9 % IJ SOLN
250.0000 mg | Freq: Once | INTRAVENOUS | Status: AC
  Administered 2015-02-19: 250 mg via INTRAVENOUS
  Filled 2015-02-19: qty 20

## 2015-02-19 NOTE — Patient Instructions (Signed)
[place] at [facility] Discharge Instructions  RECOMMENDATIONS MADE BY THE CONSULTANT AND ANY TEST RESULTS WILL BE SENT TO YOUR REFERRING PHYSICIAN.  Today you received iron infusion. Return as scheduled for lab work and office visits.  Thank you for choosing [place] at [facility] to provide your oncology and hematology care.  To afford each patient quality time with our provider, please arrive at least 15 minutes before your scheduled appointment time.    You need to re-schedule your appointment should you arrive 10 or more minutes late.  We strive to give you quality time with our providers, and arriving late affects you and other patients whose appointments are after yours.  Also, if you no show three or more times for appointments you may be dismissed from the clinic at the providers discretion.     Again, thank you for choosing [facility].  Our hope is that these requests will decrease the amount of time that you wait before being seen by our physicians.       _____________________________________________________________  Should you have questions after your visit to [facility], please contact our office at [phone] between the hours of 8:30 a.m. and 4:30 p.m.  Voicemails left after 4:30 p.m. will not be returned until the following business day.  For prescription refill requests, have your pharmacy contact our office.

## 2015-02-19 NOTE — Progress Notes (Signed)
Tolerated infusion w/o adverse reaction; VSS; a&ox4; in no distress.

## 2015-02-20 ENCOUNTER — Telehealth (HOSPITAL_COMMUNITY): Payer: Self-pay | Admitting: *Deleted

## 2015-02-20 ENCOUNTER — Telehealth (HOSPITAL_COMMUNITY): Payer: Self-pay | Admitting: Emergency Medicine

## 2015-02-20 NOTE — Telephone Encounter (Signed)
Pt called and stated she thought she was having a reaction to the iron treatment that she had yesterday.  Spoke with Kirby Crigler P-AC and told her to take benadrl 50 mg every 6 hours.  To call us back at 12 pm if symptoms were no better.

## 2015-02-20 NOTE — Telephone Encounter (Signed)
Call to patient in f/u of her triage line call from 6:41 pm 02/19/2015. No answer. Message left for patient to call back. Triage report states she had hives, legs swelling and red.

## 2015-02-21 ENCOUNTER — Telehealth (HOSPITAL_COMMUNITY): Payer: Self-pay

## 2015-02-21 NOTE — Telephone Encounter (Signed)
-----   Message from Patrici Ranks, MD sent at 02/21/2015 11:10 AM EDT ----- Make sure taking benadryl. See if hives gone. If not we can give medrol dose pak. Dr.P

## 2015-02-21 NOTE — Telephone Encounter (Signed)
Message left for patient to call office to discuss. 

## 2015-02-21 NOTE — Telephone Encounter (Signed)
Per patient, she does not have any hives, has some puffiness in her upper legs and thighs, no burning or itching but legs do ache a little.  Is taking benadryl in the afternoon and at bedtime.  Discussed with Dr. Whitney Muse and recommends no changes at this time.  To call back with any questions.

## 2015-02-26 ENCOUNTER — Telehealth: Payer: Self-pay | Admitting: Gastroenterology

## 2015-02-26 ENCOUNTER — Other Ambulatory Visit: Payer: Self-pay

## 2015-02-26 DIAGNOSIS — K922 Gastrointestinal hemorrhage, unspecified: Secondary | ICD-10-CM

## 2015-02-26 DIAGNOSIS — D509 Iron deficiency anemia, unspecified: Secondary | ICD-10-CM

## 2015-02-26 NOTE — Telephone Encounter (Signed)
Called pt. LMOM to call back regarding path results

## 2015-02-26 NOTE — Telephone Encounter (Signed)
Called pt insurance and no pre cert is required Ref # [id number]

## 2015-02-26 NOTE — Telephone Encounter (Signed)
Please call pt. HER stomach Bx shows gastritis.  HER SMALL BOWEL BIOPSIES ARE NORMAL.   CONTINUE ACIPHEX. FOLLOW A LOW FAT/HIGH FIBER DIET.  SHE NEEDS GIVENS CAPSULE ENDOSCOPY TO COMPLETE HER EVALUATION FOR IRON DEFICIENCY ANEMIA/OBSCURE GI BLEED. FOLLOW UP IN JUL 2016 E30 ANEMIA/ABD PAIN.

## 2015-02-26 NOTE — Telephone Encounter (Signed)
Spoke with Beth at [facility] to double check that no pre-cert is needed.  She also states no pre-cert is required.

## 2015-02-26 NOTE — Telephone Encounter (Signed)
[name] [name] is scheduled for 03/20/2015.  Mailed instructions to pt. Called and [facility]

## 2015-02-26 NOTE — Telephone Encounter (Signed)
REMINDER IN EPIC °

## 2015-02-26 NOTE — Telephone Encounter (Signed)
Pt is aware of results. She said any day of the week after May 2

## 2015-02-28 ENCOUNTER — Telehealth (HOSPITAL_COMMUNITY): Payer: Self-pay | Admitting: *Deleted

## 2015-02-28 NOTE — Telephone Encounter (Signed)
Patient called to report some "prickly heat" and "almost" itching when she gets "warm". Discussed this with T. Kefalas PA and Dr. Whitney Muse and conveyed to patient that it is highly unlikely that this is related to iron infusion. She verbalized understanding.

## 2015-03-01 ENCOUNTER — Telehealth: Payer: Self-pay | Admitting: Internal Medicine

## 2015-03-01 NOTE — Telephone Encounter (Signed)
Spoke with pt, states she recently had 2 Iron iv infusions at [facility], after the 2nd iv pt broke out in hives, swelling, painful bruising feeling in thighs and ankles mostly, X1 day.  Pt took benadryl which helped this.  Pt is still c/o "prickly heat" feelings in different places- stomach, head, arms- changes daily.  Pt's hematologist prescribed these infusions for pt.  A steroid taper was suggested but not given to pt.   Pt is wanting CY's take on this-is this normal?  Could it be an allergic reaction to the iron?  Does he have any suggestions to help with the burning feelings she is having?  Last ov: 09/17/14 Next ov: 03/18/15  Pt aware that she may not receive recs until Monday. Dr. Annamaria Boots please advise.  Thanks!  Allergies  Allergen Reactions  . Sulfonamide Derivatives Anaphylaxis   Current Outpatient Prescriptions on File Prior to Visit  Medication Sig Dispense Refill  . albuterol (PROVENTIL HFA) 108 (90 BASE) MCG/ACT inhaler Inhale 2 puffs into the lungs 4 (four) times daily as needed. For shortness of breath    . albuterol (PROVENTIL) (2.5 MG/3ML) 0.083% nebulizer solution Take 3 mLs (2.5 mg total) by nebulization 4 (four) times daily as needed. For shortness of breath 120 mL 5  . Azelastine HCl (ASTEPRO) 0.15 % SOLN Place 2 puffs into the nose at bedtime.      . B Complex-C (SUPER B COMPLEX PO) Take 1 tablet by mouth daily.    . beclomethasone (QVAR) 80 MCG/ACT inhaler Inhale 2 puffs into the lungs 2 (two) times daily. 1 Inhaler 6  . bifidobacterium infantis (ALIGN) capsule Take 1 capsule by mouth daily.    . cetirizine (ZYRTEC) 10 MG tablet Take 10 mg by mouth daily.      . diphenhydrAMINE (BENADRYL) 25 mg capsule Take 25 mg by mouth at bedtime as needed for sleep.    Marland Kitchen EPINEPHrine (EPI-PEN) 0.3 mg/0.3 mL SOAJ injection Inject 0.3 mLs (0.3 mg total) into the muscle once. 1 Device 0  . guaiFENesin (MUCINEX) 600 MG 12 hr tablet Take 1,200 mg by mouth 2 (two) times daily as  needed for cough.     . levothyroxine (SYNTHROID, LEVOTHROID) 25 MCG tablet Take 12.5-25 mcg by mouth daily before breakfast. Alternates with 12.23mcg 1 day then 73mcg the next    . LO LOESTRIN FE 1 MG-10 MCG / 10 MCG tablet Take 1 tablet by mouth daily.  7  . montelukast (SINGULAIR) 10 MG tablet Take 1 tablet (10 mg total) by mouth at bedtime. 30 tablet 6  . Multiple Vitamin (MULITIVITAMIN WITH MINERALS) TABS Take 1 tablet by mouth daily.    . Prenat w/o A-FeCbGl-DSS-FA-DHA (CITRANATAL ASSURE) 35-1 & 300 MG tablet     . RABEprazole (ACIPHEX) 20 MG tablet Take 20 mg by mouth daily.     . RESTASIS 0.05 % ophthalmic emulsion      No current facility-administered medications on file prior to visit.

## 2015-03-01 NOTE — Telephone Encounter (Signed)
This was probably an allergic reaction to some component of the intravenous iron preparation. The specific product should be listed on her allergy list. She might tolerate a different product, but might not.  Symptoms should gradually fade over next few days. Antihistamines and maybe tylenol may help.

## 2015-03-01 NOTE — Telephone Encounter (Signed)
Pt aware of CY's recs.  Will speak to her hematologist to see what exactly needs to be added to her allergy list.  Nothing further needed at this time.

## 2015-03-06 ENCOUNTER — Telehealth: Payer: Self-pay | Admitting: Internal Medicine

## 2015-03-06 NOTE — Telephone Encounter (Signed)
Spoke with pt. States that she is having issues with the "prickling" sensation on her stomach, arms and on her head. She has developed thick mucus drainage at night time. Mucus is yellow in color. At times the mucus has "chunks" in it. Would like to know what to do.  Allergies  Allergen Reactions  . Sulfonamide Derivatives Anaphylaxis    CY - please advise. Thanks.

## 2015-03-06 NOTE — Telephone Encounter (Signed)
Pt is aware of CY's recommendations. Nothing further was needed. 

## 2015-03-06 NOTE — Telephone Encounter (Signed)
If antihistamines and skin moisturizers don't help the prickling sensation then I don't have any other suggestion, and she can see a dermatologist.  The sinus drainage is increased now with high pollen. She might check drug stores for a Milta Deiters Med "Sinugator" which is otc device that uses water pulse to help wash nasal airway.

## 2015-03-13 ENCOUNTER — Other Ambulatory Visit: Payer: Self-pay | Admitting: Internal Medicine

## 2015-03-18 ENCOUNTER — Ambulatory Visit (INDEPENDENT_AMBULATORY_CARE_PROVIDER_SITE_OTHER): Payer: Managed Care, Other (non HMO) | Admitting: Internal Medicine

## 2015-03-18 ENCOUNTER — Encounter: Payer: Self-pay | Admitting: Internal Medicine

## 2015-03-18 VITALS — BP 116/70 | HR 60 | Ht 60.0 in | Wt 118.0 lb

## 2015-03-18 DIAGNOSIS — J309 Allergic rhinitis, unspecified: Secondary | ICD-10-CM

## 2015-03-18 DIAGNOSIS — J302 Other seasonal allergic rhinitis: Secondary | ICD-10-CM

## 2015-03-18 DIAGNOSIS — J45998 Other asthma: Secondary | ICD-10-CM

## 2015-03-18 DIAGNOSIS — J3089 Other allergic rhinitis: Secondary | ICD-10-CM

## 2015-03-18 NOTE — Patient Instructions (Addendum)
Ok to see how you feel without the steroid Qvar, or with less ( 1 puff twice daily)  Otherwise we can continue as you are doing. Please call as needed  Consider if she needs a PFT at next visit

## 2015-03-18 NOTE — Progress Notes (Signed)
10/14/11- 23 yoF never smoker followed for allergic rhinitis, rhinosinusitis.  PCP Mickie Hillier LOV- 06/19/10 She comes alone this visit but has many small children and is considering having another. Has had flu vaccine. Did well through the summer. With onset of early fall weather, began noting more of thick mucus. Frequent colds cycle through her family. She doesn't know when her mild sense of head and chest congestion may be from a viral infection. She now has a nebulizer machine with albuterol. This helps "some, a little". Sticky sensation doesn't go away. Can occasionally cough up a little white phlegm. Mucinex helps. We discussed the importance of hydration as the indoor heat comes on and she admits she probably does not drink enough fluid.  06/09/12- 88 yoF never smoker followed for allergic rhinitis, rhinosinusitis. Asthma       PCP Mickie Hillier Pt states having some chest congestion,productive cough,sob,denies any wheezing. Pt states still  taking allergy injections at home . She comes today with 4 small children. She feels as if she is "breathing through a straw" at times but never notices wheezing. A few colds. Occasional cough. Throat feels "sticky". She uses rescue inhaler before exercise. Recognizes no problems with her allergy vaccine.  05/17/13- 89 yoF never smoker followed for allergic rhinitis, rhinosinusitis. Asthma, complicated by hypothyroid, hiatal hernia     PCP Mickie Hillier No antihistamines, OTC cough syrups, or OTC sleep aids in past 3 days. Has been on allergy vaccine 1:10. Concerned that tight feeling in throat with much postnasal drip this spring, increased nasal congestion all may indicate need to retest. Allergy skin testing 05/17/13- significant positives especially for wheeze and tree pollens, cat. This is a significant pattern change since her original testing.  05/17/14- 68 yoF never smoker followed for allergic rhinitis, rhinosinusitis. Asthma, complicated by hypothyroid,  hiatal hernia     PCP [place]: Had sinus surgery 01-2014/ Dr Benjamine Mola; throat feels puffy since May 2015-comes and goes Back on Allergy vaccine1:10 GO for 2-3 months. She has a second dog now, and several children as potential sources of virus infections Recently finished amoxicillin for ? Sinusitis, without effect Says acid blocker does not affect heartburn/globus sensation. Complains again of a "stickiness" sensation in her airways.  09/17/14- 22 yoF never smoker followed for allergic rhinitis, rhinosinusitis. Asthma, complicated by hypothyroid, hiatal hernia     PCP Mickie Hillier FOLLOWS FOR: Patient recovering from walking pneumo. She is still having sob, and chest tightness. Patient denies wheezing. No CXR was done. She is "mostly better" after levaquin 10 days, but still some chest tightness. She asks about some predniosne to help clear chest. Denies fever, purulent sputum, nodes.  03/18/15- 47 yoF never smoker followed for allergic rhinitis, rhinosinusitis. Asthma, complicated by hypothyroid, hiatal hernia     PCP Mickie Hillier Here with small, active children Follows LFY:BOFBP some; SOB at all times when it happens; runny nose; Uses nebulizer once each night, continues Qvar and rarely uses rescue inhaler. Iron deficiency was treated with IV iron replacement which caused itching, now resolved. CXR 09/24/14 FINDINGS: Cardiomediastinal silhouette is stable. No acute infiltrate or pleural effusion. No pulmonary edema. Minimal degenerative changes mid thoracic spine. IMPRESSION: No active cardiopulmonary disease. Electronically Signed  By: Lahoma Crocker M.D.  //? Time to update pft?//  ROS-see HPI Constitutional:   No-   weight loss, night sweats, fevers, chills, fatigue, lassitude. HEENT:   No-  headaches, difficulty swallowing, tooth/dental problems, sore throat,  No-  sneezing, itching, ear ache, +nasal congestion, +post nasal drip,  CV:  No-   chest pain, orthopnea,  PND, swelling in lower extremities, anasarca, dizziness, palpitations Resp: + tightness/ shortness of breath with exertion or at rest.              No-   productive cough,  No non-productive cough,  No- coughing up of blood.              No-   change in color of mucus.  No- wheezing.   Skin: No-   rash or lesions. GI:  No-   heartburn, indigestion, abdominal pain, nausea, vomiting,  GU:  MS:  No-   joint pain or swelling.   Neuro-     nothing unusual Psych:  No- change in mood or affect. No depression or anxiety.  No memory loss.  OBJ General- Alert, Oriented, Affect-appropriate, Distress- none acute, muscular Skin- rash-none, lesions- none, excoriation- none Lymphadenopathy- none Head- atraumatic            Eyes- Gross vision intact, PERRLA, conjunctivae clear secretions            Ears- Hearing, canals-normal            Nose- Clear, no-Septal dev, mucus, polyps, erosion, perforation             Throat- Mallampati II , mucosa normal , drainage- none, tonsils- atrophic, voice normal Neck- flexible , trachea midline, no stridor , thyroid nl, carotid no bruit Chest - symmetrical excursion , unlabored           Heart/CV- RRR , 1/6 systolicmurmur , no gallop  , no rub, nl s1 s2                           - JVD- none , edema- none, stasis changes- none, varices- none           Lung- clear to P&A, wheeze- none, cough- none , dullness-none, rub- none           Chest wall-  Abd-  Br/ Gen/ Rectal- Not done, not indicated Extrem- cyanosis- none, clubbing, none, atrophy- none, strength- nl Neuro- grossly intact to observation

## 2015-03-20 ENCOUNTER — Encounter (HOSPITAL_COMMUNITY)
Admission: RE | Disposition: A | Discharge status: Home / Self Care | Payer: Self-pay | Source: Ambulatory Visit | Attending: Gastroenterology

## 2015-03-20 ENCOUNTER — Ambulatory Visit (HOSPITAL_COMMUNITY)
Admission: RE | Admit: 2015-03-20 | Discharge: 2015-03-20 | Disposition: A | Discharge status: Home / Self Care | Payer: Managed Care, Other (non HMO) | Source: Ambulatory Visit | Attending: Gastroenterology | Admitting: Gastroenterology

## 2015-03-20 ENCOUNTER — Encounter (HOSPITAL_COMMUNITY): Payer: Self-pay | Admitting: *Deleted

## 2015-03-20 DIAGNOSIS — D509 Iron deficiency anemia, unspecified: Secondary | ICD-10-CM

## 2015-03-20 DIAGNOSIS — K922 Gastrointestinal hemorrhage, unspecified: Secondary | ICD-10-CM | POA: Insufficient documentation

## 2015-03-20 HISTORY — PX: GIVENS CAPSULE STUDY: SHX5432

## 2015-03-20 SURGERY — IMAGING PROCEDURE, GI TRACT, INTRALUMINAL, VIA CAPSULE

## 2015-03-22 ENCOUNTER — Encounter (HOSPITAL_COMMUNITY): Payer: Self-pay | Admitting: Gastroenterology

## 2015-03-26 ENCOUNTER — Other Ambulatory Visit (HOSPITAL_COMMUNITY): Payer: Self-pay | Admitting: Oncology

## 2015-03-26 ENCOUNTER — Telehealth (HOSPITAL_COMMUNITY): Payer: Self-pay

## 2015-03-26 DIAGNOSIS — D509 Iron deficiency anemia, unspecified: Secondary | ICD-10-CM

## 2015-03-26 MED ORDER — IRON POLYSACCH CMPLX-B12-FA 150-0.025-1 MG PO CAPS: ORAL_CAPSULE | ORAL | Status: AC

## 2015-03-26 NOTE — Telephone Encounter (Signed)
Patient notified that prescription for Iron Polysacch [id number] 150-0.025-1 MG CAPS was e-scribed to her pharmacy.

## 2015-03-26 NOTE — Telephone Encounter (Signed)
Message from patient requesting that the oral iron prepration that Dr. Whitney Muse discussed on her last visit be called in to her pharmacy.  Has not had any iron since last infusion.  States "Dr. Whitney Muse spoke with me about an iron pill that would not cause as much GI problems and I thought it was going to be called to my pharmacy but so far nothing has been called in."  Can be reached at [phone].

## 2015-03-26 NOTE — Telephone Encounter (Signed)
Message from patient requesting that the iron preparation that doesn't cause GI symptoms that Dr. Whitney Muse discussed on last office visit be called in to her pharmacy.  Has not had any type of iron since last infusion on

## 2015-03-27 ENCOUNTER — Telehealth: Payer: Self-pay

## 2015-03-27 NOTE — Telephone Encounter (Signed)
Pt is calling to fine out what the results are from her GIVENS. Please advise

## 2015-03-28 ENCOUNTER — Encounter: Payer: Self-pay | Admitting: Gastroenterology

## 2015-03-28 NOTE — Telephone Encounter (Signed)
PLEASE CALL PT. WE WILL CALL HER WITH HER RESULTS TOMORROW.

## 2015-03-29 ENCOUNTER — Telehealth: Payer: Self-pay | Admitting: Gastroenterology

## 2015-03-29 NOTE — Telephone Encounter (Signed)
PATIENT CALLED INQUIRING ABOUT CAPSULE STUDY RESULTS  PLEASE ADVISE  [phone]

## 2015-03-29 NOTE — Procedures (Signed)
  PATIENT DATA: GASTRIC PASSAGE TIME: 8 m, SB PASSAGE TIME: 2H 30 m  RESULTS: LIMITED views of gastric mucosa due to retained contents. No blood in the stomach. OCCASIONAL  SMALL BOWEL AVMs. No masses,OR ULCERS SEEN. NO OLD BLOOD OR FRESH BLOOD SEEN. LIMITED VIEWS OF THE COLON DUE TO RETAINED CONTENTS.  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 CONSIDER PO OR IRON 2. AVOID ASA/NSAIDS UNLESS MEDICALLY NECESSARY 3. OPV IN SEP 2016.

## 2015-03-29 NOTE — Telephone Encounter (Signed)
PLEASE CALL PT. HER CAPSULE STUDY SHOWS Arteriovenous Malformation(AVMs) A SUPERFICIAL COLLECTION OF BLOOD VESSELS ON THE SURFACE OF THE SMALL BOWEL THAT CAUSE YOU TO LOSE BLOOD BUT YOU DO NOT SEE ANYTHING COME OUT.  An AVM may occur in the STOMACH, COLON, OR SMALL BOWEL.  HER LAST BLOOD COUNT WAS NORMAL. SHE SHOULD HAVE HER BLOOD CHECKED AT LEAST TWICE A YEAR AND ONLY USE ASPIRIN, BC/GOODY POWDERS, IBUPROFEN/MOTRIN, OR NAPROXEN/ALEVE IF MEDICALLY NECESSARY.  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

## 2015-03-29 NOTE — Telephone Encounter (Signed)
Tried to call with no answer  

## 2015-03-31 NOTE — Assessment & Plan Note (Signed)
No wheeze or cough. She has had a stable persistent and very vague sense of dyspnea for a long time, interpreted as mild asthma with no objective findings Plan-consider updating PFT at next visit

## 2015-03-31 NOTE — Assessment & Plan Note (Signed)
Denies significant hayfever symptoms now during Spring pollen season Plan-OTC antihistamine if needed

## 2015-04-01 NOTE — Telephone Encounter (Signed)
LMOM for a return call.  

## 2015-04-01 NOTE — Telephone Encounter (Signed)
Pt is aware of results. Routing to [place] to nic the next appt.

## 2015-04-01 NOTE — Telephone Encounter (Signed)
ON RECALL  °

## 2015-04-01 NOTE — Telephone Encounter (Signed)
See result note. I LMOM for a return call.

## 2015-04-04 ENCOUNTER — Telehealth: Payer: Self-pay | Admitting: Internal Medicine

## 2015-04-04 DIAGNOSIS — J45998 Other asthma: Secondary | ICD-10-CM

## 2015-04-04 NOTE — Telephone Encounter (Signed)
lmtcb x1 

## 2015-04-05 NOTE — Telephone Encounter (Signed)
LMTCB x 2 Need to verify DME

## 2015-04-05 NOTE — Telephone Encounter (Signed)
Noted, I will fill order next wk.. Nothing further needed.

## 2015-04-05 NOTE — Telephone Encounter (Signed)
Spoke with the pt and verified order and DME  Order was sent to [facility]  Nothing further needed

## 2015-04-07 ENCOUNTER — Other Ambulatory Visit: Payer: Self-pay | Admitting: Internal Medicine

## 2015-04-09 ENCOUNTER — Telehealth: Payer: Self-pay | Admitting: Internal Medicine

## 2015-04-09 MED ORDER — PREDNISONE 10 MG PO TABS: ORAL_TABLET | ORAL | Status: DC

## 2015-04-09 NOTE — Telephone Encounter (Signed)
pred sent to pharm  Left the pt detailed msg

## 2015-04-09 NOTE — Telephone Encounter (Signed)
Spoke with pt. States that her allergies are flaring up. Reports increased coughing with production of white mucus, sinus congestion and chest congestion. Denies SOB, chest tigthtness or wheezing at this time. Feels like she might need some prednisone.  Allergies  Allergen Reactions  . Sulfonamide Derivatives Anaphylaxis    Current Outpatient Prescriptions on File Prior to Visit  Medication Sig Dispense Refill  . albuterol (PROVENTIL HFA) 108 (90 BASE) MCG/ACT inhaler Inhale 2 puffs into the lungs 4 (four) times daily as needed. For shortness of breath    . albuterol (PROVENTIL) (2.5 MG/3ML) 0.083% nebulizer solution Take 3 mLs (2.5 mg total) by nebulization 4 (four) times daily as needed. For shortness of breath 120 mL 5  . B Complex-C (SUPER B COMPLEX PO) Take 1 tablet by mouth daily.    . cetirizine (ZYRTEC) 10 MG tablet Take 10 mg by mouth daily.      . diphenhydrAMINE (BENADRYL) 25 mg capsule Take 25 mg by mouth at bedtime as needed for sleep.    Marland Kitchen EPINEPHrine (EPI-PEN) 0.3 mg/0.3 mL SOAJ injection Inject 0.3 mLs (0.3 mg total) into the muscle once. 1 Device 0  . fluticasone (FLONASE) 50 MCG/ACT nasal spray Place 2 sprays into both nostrils daily.    Marland Kitchen guaiFENesin (MUCINEX) 600 MG 12 hr tablet Take 1,200 mg by mouth 2 (two) times daily as needed for cough.     . Iron Polysacch Cmplx-B12-FA 150-0.025-1 MG CAPS 1 capsule daily. 30 each 5  . levothyroxine (SYNTHROID, LEVOTHROID) 25 MCG tablet Take 12.5-25 mcg by mouth daily before breakfast. Alternates with 12.69mcg 1 day then 22mcg the next    . LO LOESTRIN FE 1 MG-10 MCG / 10 MCG tablet Take 1 tablet by mouth daily.  7  . montelukast (SINGULAIR) 10 MG tablet Take 1 tablet (10 mg total) by mouth at bedtime. 30 tablet 6  . QVAR 80 MCG/ACT inhaler INHALE 2 PUFFS BY MOUTH TWICE DAILY 8.7 g 0  . RABEprazole (ACIPHEX) 20 MG tablet Take 20 mg by mouth daily.     . RESTASIS 0.05 % ophthalmic emulsion      No current facility-administered  medications on file prior to visit.    CY - please advise. Thanks.

## 2015-04-09 NOTE — Telephone Encounter (Signed)
Offer prednisone 10 mg, # 8, 2 today then one daily

## 2015-04-12 ENCOUNTER — Ambulatory Visit (INDEPENDENT_AMBULATORY_CARE_PROVIDER_SITE_OTHER): Payer: Managed Care, Other (non HMO)

## 2015-04-12 ENCOUNTER — Telehealth: Payer: Self-pay | Admitting: Internal Medicine

## 2015-04-12 DIAGNOSIS — J309 Allergic rhinitis, unspecified: Secondary | ICD-10-CM

## 2015-04-18 ENCOUNTER — Telehealth: Payer: Self-pay | Admitting: Internal Medicine

## 2015-04-18 NOTE — Telephone Encounter (Signed)
Pt added to see CDY tomorrow. Nothing further needed

## 2015-04-19 ENCOUNTER — Ambulatory Visit (INDEPENDENT_AMBULATORY_CARE_PROVIDER_SITE_OTHER): Payer: Managed Care, Other (non HMO) | Admitting: Internal Medicine

## 2015-04-19 ENCOUNTER — Other Ambulatory Visit (INDEPENDENT_AMBULATORY_CARE_PROVIDER_SITE_OTHER): Payer: Managed Care, Other (non HMO)

## 2015-04-19 ENCOUNTER — Encounter: Payer: Self-pay | Admitting: Internal Medicine

## 2015-04-19 VITALS — BP 118/64 | HR 106 | Ht 60.0 in | Wt 117.0 lb

## 2015-04-19 DIAGNOSIS — R682 Dry mouth, unspecified: Secondary | ICD-10-CM | POA: Diagnosis not present

## 2015-04-19 DIAGNOSIS — J45998 Other asthma: Secondary | ICD-10-CM | POA: Diagnosis not present

## 2015-04-19 DIAGNOSIS — R0600 Dyspnea, unspecified: Secondary | ICD-10-CM

## 2015-04-19 LAB — SEDIMENTATION RATE: Sed Rate: 15 mm/hr (ref 0–22)

## 2015-04-19 MED ORDER — FLUTICASONE FUROATE-VILANTEROL 100-25 MCG/INH IN AEPB: 1.0000 | INHALATION_SPRAY | Freq: Every day | RESPIRATORY_TRACT | Status: DC

## 2015-04-19 NOTE — Progress Notes (Signed)
10/14/11- 9 yoF never smoker followed for allergic rhinitis, rhinosinusitis.  PCP Mickie Hillier LOV- 06/19/10 She comes alone this visit but has many small children and is considering having another. Has had flu vaccine. Did well through the summer. With onset of early fall weather, began noting more of thick mucus. Frequent colds cycle through her family. She doesn't know when her mild sense of head and chest congestion may be from a viral infection. She now has a nebulizer machine with albuterol. This helps "some, a little". Sticky sensation doesn't go away. Can occasionally cough up a little white phlegm. Mucinex helps. We discussed the importance of hydration as the indoor heat comes on and she admits she probably does not drink enough fluid.  06/09/12- 63 yoF never smoker followed for allergic rhinitis, rhinosinusitis. Asthma       PCP Mickie Hillier Pt states having some chest congestion,productive cough,sob,denies any wheezing. Pt states still  taking allergy injections at home . She comes today with 4 small children. She feels as if she is "breathing through a straw" at times but never notices wheezing. A few colds. Occasional cough. Throat feels "sticky". She uses rescue inhaler before exercise. Recognizes no problems with her allergy vaccine.  05/17/13- 88 yoF never smoker followed for allergic rhinitis, rhinosinusitis. Asthma, complicated by hypothyroid, hiatal hernia     PCP Mickie Hillier No antihistamines, OTC cough syrups, or OTC sleep aids in past 3 days. Has been on allergy vaccine 1:10. Concerned that tight feeling in throat with much postnasal drip this spring, increased nasal congestion all may indicate need to retest. Allergy skin testing 05/17/13- significant positives especially for wheeze and tree pollens, cat. This is a significant pattern change since her original testing.  05/17/14- 46 yoF never smoker followed for allergic rhinitis, rhinosinusitis. Asthma, complicated by hypothyroid,  hiatal hernia     PCP [place]: Had sinus surgery 01-2014/ Dr Benjamine Mola; throat feels puffy since May 2015-comes and goes Back on Allergy vaccine1:10 GO for 2-3 months. She has a second dog now, and several children as potential sources of virus infections Recently finished amoxicillin for ? Sinusitis, without effect Says acid blocker does not affect heartburn/globus sensation. Complains again of a "stickiness" sensation in her airways.  09/17/14- 46 yoF never smoker followed for allergic rhinitis, rhinosinusitis. Asthma, complicated by hypothyroid, hiatal hernia     PCP Mickie Hillier FOLLOWS FOR: Patient recovering from walking pneumo. She is still having sob, and chest tightness. Patient denies wheezing. No CXR was done. She is "mostly better" after levaquin 10 days, but still some chest tightness. She asks about some predniosne to help clear chest. Denies fever, purulent sputum, nodes.  03/18/15- 75 yoF never smoker followed for allergic rhinitis, rhinosinusitis. Asthma, complicated by hypothyroid, hiatal hernia     PCP Mickie Hillier Here with small, active children Follows RDE:YCXKG some; SOB at all times when it happens; runny nose; Uses nebulizer once each night, continues Qvar and rarely uses rescue inhaler. Iron deficiency was treated with IV iron replacement which caused itching, now resolved. CXR 09/24/14 FINDINGS: Cardiomediastinal silhouette is stable. No acute infiltrate or pleural effusion. No pulmonary edema. Minimal degenerative changes mid thoracic spine. IMPRESSION: No active cardiopulmonary disease. Electronically Signed  By: Lahoma Crocker M.D.  //? Time to update pft?//  04/19/15- 42 yoF never smoker followed for allergic rhinitis, rhinosinusitis. Asthma, complicated by hypothyroid, hiatal hernia     PCP Mickie Hillier Here with small, active children ACUTE VISIT: Pt states she feels  like her lungs are achy, having a sore throat, and sinus drainage;feels like she is  swollen. Uncomfortable for 2 weeks, minimal dry cough, no wheeze, much postnasal drainage with clear mucus. Throat feels "swollen". Head GI endoscopy and was told she might have Sjogren's, asking [place] to check labs.  ROS-see HPI Constitutional:   No-   weight loss, night sweats, fevers, chills, fatigue, lassitude. HEENT:   No-  headaches, difficulty swallowing, tooth/dental problems, sore throat,       No-  sneezing, itching, ear ache, +nasal congestion, +post nasal drip,  CV:  No-   chest pain, orthopnea, PND, swelling in lower extremities, anasarca, dizziness, palpitations Resp: + tightness/ shortness of breath with exertion or at rest.              No-   productive cough,  + non-productive cough,  No- coughing up of blood.              No-   change in color of mucus.  No- wheezing.   Skin: No-   rash or lesions. GI:  No-   heartburn, indigestion, abdominal pain, nausea, vomiting,  GU:  MS:  No-   joint pain or swelling.   Neuro-     nothing unusual Psych:  No- change in mood or affect. No depression or anxiety.  No memory loss.  OBJ General- Alert, Oriented, Affect-appropriate, Distress- none acute, muscular Skin- rash-none, lesions- none, excoriation- none Lymphadenopathy- none Head- atraumatic            Eyes- Gross vision intact, PERRLA, conjunctivae clear secretions            Ears- Hearing, canals-normal            Nose- Clear, no-Septal dev, mucus, polyps, erosion, perforation             Throat- Mallampati II , mucosa normal-not significantly dry , drainage- none, tonsils- atrophic, voice normal Neck- flexible , trachea midline, no stridor , thyroid nl, carotid no bruit Chest - symmetrical excursion , unlabored           Heart/CV- RRR , 1/6 systolicmurmur , no gallop  , no rub, nl s1 s2                           - JVD- none , edema- none, stasis changes- none, varices- none           Lung- clear to P&A, wheeze- none, cough- none , dullness-none, rub- none           Chest wall-   Abd-  Br/ Gen/ Rectal- Not done, not indicated Extrem- cyanosis- none, clubbing, none, atrophy- none, strength- nl Neuro- grossly intact to observation

## 2015-04-19 NOTE — Patient Instructions (Addendum)
Order- lab- Sjogrens assay, ANA, sed rate  Order- schedule PFT    Dx dyspnea  Sample Breo Ellipta 100    1 puff then rinse mouth, once daily, instead of Qvar

## 2015-04-20 NOTE — Assessment & Plan Note (Addendum)
Any asthma is mild, intermittent, uncomplicated, well controlled Symptoms are subtle and hard to pin down. Anxiety component may be important. Plan-Therapeutic trial of Breo Ellipta inhaler

## 2015-04-20 NOTE — Assessment & Plan Note (Signed)
Question of Sjogren's has been raised Plan-Sjogren's assay

## 2015-04-22 LAB — SJOGREN'S SYNDROME ANTIBODS(SSA + SSB)
SSA (RO) (ENA) ANTIBODY, IGG: NEGATIVE
SSB (LA) (ENA) ANTIBODY, IGG: NEGATIVE

## 2015-04-22 LAB — ANA: Anti Nuclear Antibody(ANA): NEGATIVE

## 2015-04-23 ENCOUNTER — Telehealth: Payer: Self-pay | Admitting: Internal Medicine

## 2015-04-23 NOTE — Telephone Encounter (Signed)
Notes Recorded by Deneise Lever, MD on 04/22/2015 at 11:50 AM Tests for Sjogren's and lupus are negative. Would just try to manage symptoms and not try additional medicines for now.  LMTCB

## 2015-04-24 NOTE — Telephone Encounter (Signed)
Pt is aware of results. 

## 2015-04-24 NOTE — Telephone Encounter (Signed)
lmtcb X2 for pt.  

## 2015-04-26 ENCOUNTER — Ambulatory Visit (HOSPITAL_COMMUNITY): Payer: Managed Care, Other (non HMO) | Admitting: Hematology & Oncology

## 2015-04-26 ENCOUNTER — Other Ambulatory Visit (HOSPITAL_COMMUNITY): Payer: Managed Care, Other (non HMO)

## 2015-05-01 ENCOUNTER — Telehealth: Payer: Self-pay | Admitting: Internal Medicine

## 2015-05-01 ENCOUNTER — Other Ambulatory Visit: Payer: Self-pay | Admitting: Obstetrics & Gynecology

## 2015-05-01 MED ORDER — FLUTICASONE FUROATE-VILANTEROL 100-25 MCG/INH IN AEPB: 1.0000 | INHALATION_SPRAY | Freq: Every day | RESPIRATORY_TRACT | Status: DC

## 2015-05-01 NOTE — Telephone Encounter (Signed)
Rx has been sent in for [facility]. Pt is aware. Nothing further was needed.

## 2015-05-02 LAB — CYTOLOGY - PAP

## 2015-05-05 ENCOUNTER — Other Ambulatory Visit: Payer: Self-pay | Admitting: Internal Medicine

## 2015-05-06 ENCOUNTER — Other Ambulatory Visit: Payer: Self-pay | Admitting: Internal Medicine

## 2015-05-06 ENCOUNTER — Telehealth: Payer: Self-pay | Admitting: Internal Medicine

## 2015-05-06 NOTE — Telephone Encounter (Signed)
I don't expect it to be a side effect of Breo. If this feeling doesn't clear after a few days, like a cold progressing, then ok to stop Breo and see what happens. Also recognize that we have had days of ozone alerts, which can cause vague airway irritation if a person is outside much during these days.

## 2015-05-06 NOTE — Telephone Encounter (Signed)
Spoke with pt, states she recently was changed to [facility] from qvar approx 10 days ago.  Pt states she is doing well on the breo but has been dealing with cold-like symptoms X2-3 days, denies any fever, chest pain, discolored mucus.   Pt wants to make sure that this isn't a side effect of the medication, or if this is an adverse effect from [facility] if she needs to go back to Qvar. Pt uses [facility] in [place].    Dr. Annamaria Boots please advise.  Thanks!

## 2015-05-06 NOTE — Telephone Encounter (Signed)
lmtcb x1 for pt. 

## 2015-05-06 NOTE — Telephone Encounter (Signed)
Spoke with pt, she is aware of the below recs.  Nothing further needed.

## 2015-05-13 ENCOUNTER — Encounter (HOSPITAL_BASED_OUTPATIENT_CLINIC_OR_DEPARTMENT_OTHER): Payer: Managed Care, Other (non HMO)

## 2015-05-13 ENCOUNTER — Encounter (HOSPITAL_COMMUNITY): Payer: Managed Care, Other (non HMO) | Attending: Hematology & Oncology | Admitting: Hematology & Oncology

## 2015-05-13 ENCOUNTER — Encounter (HOSPITAL_COMMUNITY): Payer: Self-pay | Admitting: Hematology & Oncology

## 2015-05-13 DIAGNOSIS — H04123 Dry eye syndrome of bilateral lacrimal glands: Secondary | ICD-10-CM

## 2015-05-13 DIAGNOSIS — D500 Iron deficiency anemia secondary to blood loss (chronic): Secondary | ICD-10-CM

## 2015-05-13 DIAGNOSIS — D508 Other iron deficiency anemias: Secondary | ICD-10-CM | POA: Insufficient documentation

## 2015-05-13 LAB — COMPREHENSIVE METABOLIC PANEL
ALK PHOS: 56 U/L (ref 38–126)
ALT: 23 U/L (ref 14–54)
ANION GAP: 8 (ref 5–15)
AST: 20 U/L (ref 15–41)
Albumin: 3.9 g/dL (ref 3.5–5.0)
BUN: 19 mg/dL (ref 6–20)
CO2: 27 mmol/L (ref 22–32)
Calcium: 8.6 mg/dL — ABNORMAL LOW (ref 8.9–10.3)
Chloride: 103 mmol/L (ref 101–111)
Creatinine, Ser: 0.84 mg/dL (ref 0.44–1.00)
Glucose, Bld: 88 mg/dL (ref 65–99)
Potassium: 3.4 mmol/L — ABNORMAL LOW (ref 3.5–5.1)
SODIUM: 138 mmol/L (ref 135–145)
Total Bilirubin: 0.7 mg/dL (ref 0.3–1.2)
Total Protein: 7.1 g/dL (ref 6.5–8.1)

## 2015-05-13 LAB — CBC WITH DIFFERENTIAL/PLATELET
Basophils Absolute: 0.1 10*3/uL (ref 0.0–0.1)
Basophils Relative: 1 % (ref 0–1)
Eosinophils Absolute: 0.1 10*3/uL (ref 0.0–0.7)
Eosinophils Relative: 2 % (ref 0–5)
HEMATOCRIT: 37.4 % (ref 36.0–46.0)
Hemoglobin: 12 g/dL (ref 12.0–15.0)
Lymphocytes Relative: 32 % (ref 12–46)
Lymphs Abs: 1.9 10*3/uL (ref 0.7–4.0)
MCH: 29.5 pg (ref 26.0–34.0)
MCHC: 32.1 g/dL (ref 30.0–36.0)
MCV: 91.9 fL (ref 78.0–100.0)
MONOS PCT: 8 % (ref 3–12)
Monocytes Absolute: 0.5 10*3/uL (ref 0.1–1.0)
Neutro Abs: 3.4 10*3/uL (ref 1.7–7.7)
Neutrophils Relative %: 57 % (ref 43–77)
Platelets: 367 10*3/uL (ref 150–400)
RBC: 4.07 MIL/uL (ref 3.87–5.11)
RDW: 14.4 % (ref 11.5–15.5)
WBC: 6 10*3/uL (ref 4.0–10.5)

## 2015-05-13 LAB — FERRITIN: Ferritin: 89 ng/mL (ref 11–307)

## 2015-05-13 LAB — IRON AND TIBC
Iron: 123 ug/dL (ref 28–170)
Saturation Ratios: 34 % — ABNORMAL HIGH (ref 10.4–31.8)
TIBC: 361 ug/dL (ref 250–450)
UIBC: 238 ug/dL

## 2015-05-13 LAB — VITAMIN B12: Vitamin B-12: 843 pg/mL (ref 180–914)

## 2015-05-13 LAB — FOLATE: Folate: 19.6 ng/mL (ref >5.9)

## 2015-05-13 NOTE — Progress Notes (Signed)
LABS DRAWN

## 2015-05-13 NOTE — Progress Notes (Signed)
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 female returns for follow-up of severe iron deficiency related to heavy menstrual cycles. She states she has been started on oral contraceptives again and her periods are improved. She has 3 heavy days and to light days. She notices however that she is feeling fatigued, more than her baseline and wonders if she is iron deficient again. She had a colonoscopy in 2013. She was on iron in the past but states she could not tolerate oral iron pills. She is doing better on niferex and can take it most days.  She is alone today and says that she has been not terrible.  She says that she overall feels better  She had problems with significant itching after her last IV iron infusion. She states it took several weeks to finally go away. She noted her symptoms abated with the use of Benadryl.  The patient says her energy level is now excellent.  She is excited to be able to weight lift now She expresses concern for  Sjogren's disease, she has noticed symptoms worsen in the past 6 months, specifically dry eyes and mouth. She has been tested by her primary care physician.  MEDICAL HISTORY: Past Medical History  Diagnosis Date  . Chronic rhinosinusitis   . Allergic rhinitis   . Cough   . Cardiac murmur   . HSV infection     History of-outbreak during pregnancy 2004  . Mono exposure     Had in January  . Thrush, oral   . Yeast infection of the vagina   . Mononucleosis 01/06/2012    has SINUSITIS, ACUTE; RHINOSINUSITIS, RECURRENT; Seasonal and perennial allergic rhinitis; CARDIAC MURMUR; COUGH; Thrush; Allergic-infective asthma; Dysphagia; GERD (gastroesophageal reflux disease); Anemia; Family history of colonic polyps; Mononucleosis; Colon adenomas; Dysphagia; Fatigue;  Abdominal pain, epigastric; IDA (iron deficiency anemia); Anal or rectal pain; Dry mouth; Rectal bleeding; and AP (abdominal pain) on her problem list.     is allergic to sulfonamide derivatives.  Ms. Malecki had no medications administered during this visit.  SURGICAL HISTORY: Past Surgical History  Procedure Laterality Date  . Cesarean section  2002,2004,2008, 2009  . Nasal sinus surgery  2005  . Colonoscopy  12/25/2011    Dr. Oneida Alar: small internal hemorrhoids, adenomas, surveillance in 2018  . Esophagogastroduodenoscopy  Feb 2013    Dr. Oneida Alar: normal stomach, duodenum, esophagus. Gastritis. hiatal hernia  . Flexible sigmoidoscopy N/A 02/11/2015    Procedure: FLEXIBLE SIGMOIDOSCOPY;  Surgeon: Danie Binder, MD;  Location: [facility];  Service: Endoscopy;  Laterality: N/A;  1045  . Esophagogastroduodenoscopy N/A 02/11/2015    Procedure: ESOPHAGOGASTRODUODENOSCOPY (EGD);  Surgeon: Danie Binder, MD;  Location: [facility];  Service: Endoscopy;  Laterality: N/A;  . Hemorrhoid banding N/A 02/11/2015    Procedure: HEMORRHOID BANDING;  Surgeon: Danie Binder, MD;  Location: [facility];  Service: Endoscopy;  Laterality: N/A;  . Givens capsule study N/A 03/20/2015    Procedure: GIVENS CAPSULE STUDY;  Surgeon: Danie Binder, MD;  Location: [facility];  Service: Endoscopy;  Laterality: N/A;  0700    SOCIAL HISTORY: History   Social History  . Marital Status: Married    Spouse Name: N/A  . Number of Children: 4  . Years of Education: N/A   Occupational History  .  Housewife-recent accounting degree.    Social History Main Topics  . Smoking status: Never Smoker   . Smokeless tobacco: Never Used  . Alcohol Use: No  . Drug Use: No  . Sexual Activity: Yes   Other Topics Concern  . Not on file   Social History Narrative    FAMILY HISTORY: Family History  Problem Relation Age of Onset  . Allergies Sister   . Allergies Brother   . Cervical cancer Mother   . Colon  polyps Mother 73    several "benign" polyps  . Cancer Mother   . Breast cancer      grandmother  . Colon cancer Neg Hx   . Cancer Maternal Grandmother     Review of Systems  Constitutional: Negative for malaise/fatigue. Negative for fever, chills and weight loss.  HENT: Negative for congestion, hearing loss, nosebleeds, sore throat and tinnitus.   Eyes: Negative for blurred vision, double vision, pain and discharge.  Respiratory: Negative for cough, hemoptysis, sputum production, shortness of breath and wheezing.   Cardiovascular: Negative for chest pain, palpitations, claudication, leg swelling and PND.  Gastrointestinal: Negative for heartburn, nausea, vomiting, abdominal pain, diarrhea, constipation, blood in stool and melena.  Genitourinary: Negative for dysuria, urgency, frequency and hematuria.  Musculoskeletal: Negative for myalgias, joint pain and falls.  Skin: Negative for itching and rash.  Neurological: Negative for dizziness, tingling, tremors, sensory change, speech change, focal weakness, seizures, loss of consciousness, weakness and headaches.  Endo/Heme/Allergies: Does not bruise/bleed easily.  Psychiatric/Behavioral: Negative for depression, suicidal ideas, memory loss and substance abuse. The patient is not nervous/anxious and does not have insomnia.   14 point review of systems was performed and is negative except as detailed under history of present illness and above   PHYSICAL EXAMINATION  ECOG PERFORMANCE STATUS: 0 - Asymptomatic  There were no vitals filed for this visit.  Physical Exam  Constitutional: She is oriented to person, place, and time and well-developed, well-nourished, and in no distress.  HENT:  Head: Normocephalic and atraumatic.  Nose: Nose normal.  Mouth/Throat: Oropharynx is clear and moist. No oropharyngeal exudate.  Eyes: Conjunctivae and EOM are normal. Pupils are equal, round, and reactive to light. Right eye exhibits no discharge. Left  eye exhibits no discharge. No scleral icterus.  Neck: Normal range of motion. Neck supple. No tracheal deviation present. No thyromegaly present.  Cardiovascular: Normal rate, regular rhythm and normal heart sounds.  Exam reveals no gallop and no friction rub.   No murmur heard. Pulmonary/Chest: Effort normal and breath sounds normal. She has no wheezes. She has no rales.  Abdominal: Soft. Bowel sounds are normal. She exhibits no distension and no mass. There is no tenderness. There is no rebound and no guarding.  Musculoskeletal: Normal range of motion. She exhibits no edema.  Lymphadenopathy:    She has no cervical adenopathy.  Neurological: She is alert and oriented to person, place, and time. She has normal reflexes. No cranial nerve deficit. Gait normal. Coordination normal.  Skin: Skin is warm and dry. No rash noted.  Psychiatric: Mood, memory, affect and judgment normal.  Nursing note and vitals reviewed.   LABORATORY DATA:  CBC    Component Value Date/Time   WBC 6.0 05/13/2015 0952   RBC 4.07 05/13/2015 0952   RBC 3.97 12/05/2013 1500   HGB 12.0 05/13/2015 0952   HCT 37.4 05/13/2015 0952   PLT 367 05/13/2015 0952   MCV 91.9 05/13/2015 0952   MCH 29.5 05/13/2015 0952  MCHC 32.1 05/13/2015 0952   RDW 14.4 05/13/2015 0952   LYMPHSABS 1.9 05/13/2015 0952   MONOABS 0.5 05/13/2015 0952   EOSABS 0.1 05/13/2015 0952   BASOSABS 0.1 05/13/2015 0952   CMP     Component Value Date/Time   NA 138 05/13/2015 0952   K 3.4* 05/13/2015 0952   CL 103 05/13/2015 0952   CO2 27 05/13/2015 0952   GLUCOSE 88 05/13/2015 0952   BUN 19 05/13/2015 0952   CREATININE 0.84 05/13/2015 0952   CREATININE 0.72 12/25/2014 1005   CALCIUM 8.6* 05/13/2015 0952   PROT 7.1 05/13/2015 0952   ALBUMIN 3.9 05/13/2015 0952   AST 20 05/13/2015 0952   ALT 23 05/13/2015 0952   ALKPHOS 56 05/13/2015 0952   BILITOT 0.7 05/13/2015 0952   GFRNONAA >60 05/13/2015 0952   GFRAA >60 05/13/2015 0952       ASSESSMENT and THERAPY PLAN:   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 She had difficulties with itching after her last IV iron infusion. We have tried her on oral Niferex and she is able to tolerate this on a fairly regular basis. She still has intermittent GI discomfort. I advised her I will call her when her ferritin level is available. Should she need additional iron we discussed other iron formulations.   Laboratory studies with her and encouraged her to take calcium supplementation and consider over-the-counter potassium.   In regards to her complaints of dry mouth and dry eyes, advised her that for now will be symptom management as she is currently doing.   All questions were answered. The patient knows to call the clinic with any problems, questions or concerns. We can certainly see the patient much sooner if necessary.   Follow up, 3 months  This note was electronically signed.  This document serves as a record of services personally performed by Ancil Linsey, MD. It was created on her behalf by Janace Hoard, a trained medical scribe. The creation of this record is based on the scribe's personal observations and the provider's statements to them. This document has been checked and approved by the attending provider.  I have reviewed the above documentation for accuracy and completeness, and I agree with the above.  Kelby Fam. Whitney Muse, MD

## 2015-05-13 NOTE — Patient Instructions (Signed)
[place] at [facility] Discharge Instructions  RECOMMENDATIONS MADE BY THE CONSULTANT AND ANY TEST RESULTS WILL BE SENT TO YOUR REFERRING PHYSICIAN.   Exam and discussion with Dr. Whitney Muse today. Return in 3 months for office visit and lab work. Call the clinic prior to your next visit with any questions or concerns.   Thank you for choosing [place] at [facility] to provide your oncology and hematology care.  To afford each patient quality time with our provider, please arrive at least 15 minutes before your scheduled appointment time.    You need to re-schedule your appointment should you arrive 10 or more minutes late.  We strive to give you quality time with our providers, and arriving late affects you and other patients whose appointments are after yours.  Also, if you no show three or more times for appointments you may be dismissed from the clinic at the providers discretion.     Again, thank you for choosing [facility].  Our hope is that these requests will decrease the amount of time that you wait before being seen by our physicians.       _____________________________________________________________  Should you have questions after your visit to [facility], please contact our office at [phone] between the hours of 8:30 a.m. and 4:30 p.m.  Voicemails left after 4:30 p.m. will not be returned until the following business day.  For prescription refill requests, have your pharmacy contact our office.

## 2015-05-15 NOTE — Telephone Encounter (Signed)
Date Mixed: 04/12/15 Vial: 1 Strength: 1:10 Here/Mail/Pick Up: mail Mixed By: tbs 

## 2015-05-22 ENCOUNTER — Telehealth: Payer: Self-pay | Admitting: Internal Medicine

## 2015-05-22 ENCOUNTER — Encounter (HOSPITAL_COMMUNITY): Payer: Self-pay

## 2015-05-22 MED ORDER — BECLOMETHASONE DIPROPIONATE 80 MCG/ACT IN AERS: 2.0000 | INHALATION_SPRAY | Freq: Two times a day (BID) | RESPIRATORY_TRACT | Status: DC

## 2015-05-22 NOTE — Telephone Encounter (Signed)
lmtcb x1 for pt. 

## 2015-05-22 NOTE — Telephone Encounter (Signed)
Spoke with pt. States that Memory Dance has been causing her thyroid symptoms. Did not state what symptoms she was having when I asked. States that when she stops Breo her symptoms go away. Wants to go back on Qvar.  CY - please advise. Thanks.

## 2015-05-22 NOTE — Telephone Encounter (Signed)
Pt returned call  [phone]

## 2015-05-22 NOTE — Telephone Encounter (Signed)
Rx sent for Qvar. Patient notified. Patient will call insurance to obtain formulary to see if there is an alternative medication that is cheaper for her.  This one costs $35 per month. Nothing further needed.

## 2015-05-22 NOTE — Telephone Encounter (Signed)
Ok to [employer] and go back to Qvar 80, 1-2 puffs then rinse mouth, twice daily, rinse mouth, refill prn If her insurance doesn't cover, we will switch to a similar product that it does cover.

## 2015-06-17 ENCOUNTER — Encounter: Payer: Self-pay | Admitting: Gastroenterology

## 2015-07-01 ENCOUNTER — Other Ambulatory Visit (HOSPITAL_COMMUNITY): Payer: Self-pay

## 2015-07-01 ENCOUNTER — Telehealth (HOSPITAL_COMMUNITY): Payer: Self-pay | Admitting: *Deleted

## 2015-07-01 MED ORDER — AZITHROMYCIN 250 MG PO TABS: ORAL_TABLET | ORAL | Status: DC

## 2015-07-01 NOTE — Telephone Encounter (Signed)
Prescription e-scribed to [facility] and message left for patient with instructions on how to take.  To call back with any questions.

## 2015-07-01 NOTE — Telephone Encounter (Signed)
Send a Z-pack to patient's pharmacy.  Advise patient. (zithromax 250mg , 2 tablets on day 1 then one tablet daily thereafter for a total of 5 days,  SIX tablets total.) Dr.P

## 2015-07-01 NOTE — Telephone Encounter (Signed)
"  Z-packs don't work for me.  MD usually has to give me something else because it hasn't worked at all in the past.  Should have said something about that when I called earlier.  Wanted to know if Dr. Whitney Muse would call in something different?  The only med allergy that I have is sulfur."

## 2015-07-02 ENCOUNTER — Other Ambulatory Visit (HOSPITAL_COMMUNITY): Payer: Self-pay

## 2015-07-02 MED ORDER — AMOXICILLIN-POT CLAVULANATE 875-125 MG PO TABS: 1.0000 | ORAL_TABLET | Freq: Two times a day (BID) | ORAL | Status: DC

## 2015-07-02 NOTE — Telephone Encounter (Signed)
Message left for [place] that Dr. Whitney Muse has prescribed Augmentin 1 twice daily for 7 days.  Prescription e-scribed to [facility] pharmacy.  Patient to see PCP if any further treatment needed.  To call back with any questions.

## 2015-07-03 ENCOUNTER — Other Ambulatory Visit: Payer: Self-pay | Admitting: Gastroenterology

## 2015-07-09 ENCOUNTER — Other Ambulatory Visit: Payer: Self-pay | Admitting: Internal Medicine

## 2015-07-10 ENCOUNTER — Encounter: Payer: Self-pay | Admitting: Internal Medicine

## 2015-07-11 ENCOUNTER — Telehealth: Payer: Self-pay | Admitting: Internal Medicine

## 2015-07-11 DIAGNOSIS — J4530 Mild persistent asthma, uncomplicated: Secondary | ICD-10-CM

## 2015-07-11 NOTE — Telephone Encounter (Signed)
lmomtcb x1 for pt--(another phone note open on pt as well)

## 2015-07-11 NOTE — Telephone Encounter (Signed)
Pt returning call.[name] [name] ° °

## 2015-07-11 NOTE — Telephone Encounter (Signed)
lmtcb for pt.  

## 2015-07-11 NOTE — Telephone Encounter (Signed)
lmomtcb x1 (another phone note open on pt)

## 2015-07-12 NOTE — Telephone Encounter (Signed)
Called and left a detailed message informing pt an order has been placed to replace her nebulizer machine and to call back with any issues. Order placed for new neb machine. Nothing further needed at this time.

## 2015-07-12 NOTE — Telephone Encounter (Signed)
Called pt. And verified her address and let her know it probably won't go out til Mon.. Nothing further needed.

## 2015-07-12 NOTE — Telephone Encounter (Signed)
Order replacement nebulizer compressor as requested,    Dx asthma mild persistent

## 2015-07-12 NOTE — Telephone Encounter (Signed)
Called and spoke to pt. Pt requesting a new nebulizer machine as the one she had the motor has died. Pt states she has had this machine for a couple of years.   Dr Annamaria Boots please advise if ok to send order for new nab machine. Thanks.

## 2015-07-12 NOTE — Telephone Encounter (Signed)
Called and spoke to pt. Pt requesting new allergy serum sent to her home. Pt aware message will be sent to [place] for follow up.   Tammy, please advise. Thanks.

## 2015-07-14 ENCOUNTER — Other Ambulatory Visit: Payer: Self-pay | Admitting: Internal Medicine

## 2015-07-15 ENCOUNTER — Other Ambulatory Visit (HOSPITAL_COMMUNITY): Payer: Self-pay

## 2015-07-15 MED ORDER — FLUCONAZOLE 100 MG PO TABS: ORAL_TABLET | ORAL | Status: AC

## 2015-07-16 ENCOUNTER — Telehealth: Payer: Self-pay | Admitting: Internal Medicine

## 2015-07-16 ENCOUNTER — Ambulatory Visit (INDEPENDENT_AMBULATORY_CARE_PROVIDER_SITE_OTHER): Payer: Managed Care, Other (non HMO)

## 2015-07-16 DIAGNOSIS — J309 Allergic rhinitis, unspecified: Secondary | ICD-10-CM | POA: Diagnosis not present

## 2015-07-16 NOTE — Telephone Encounter (Signed)
Date Mixed: 07/16/15 Vial: 1 Strength: 1:10 Here/Mail/Pick Up: mail Mixed By: Laurette Schimke

## 2015-07-22 ENCOUNTER — Other Ambulatory Visit: Payer: Self-pay | Admitting: Internal Medicine

## 2015-07-25 ENCOUNTER — Telehealth: Payer: Self-pay | Admitting: Internal Medicine

## 2015-07-25 ENCOUNTER — Ambulatory Visit (INDEPENDENT_AMBULATORY_CARE_PROVIDER_SITE_OTHER): Payer: Managed Care, Other (non HMO) | Admitting: Internal Medicine

## 2015-07-25 DIAGNOSIS — R0600 Dyspnea, unspecified: Secondary | ICD-10-CM

## 2015-07-25 LAB — PULMONARY FUNCTION TEST
DL/VA % PRED: 118 %
DL/VA: 5.21 ml/min/mmHg/L
DLCO UNC % PRED: 103 %
DLCO UNC: 20.9 ml/min/mmHg
FEF 25-75 POST: 4.11 L/s
FEF 25-75 Pre: 3.74 L/sec
FEF2575-%Change-Post: 9 %
FEF2575-%PRED-POST: 145 %
FEF2575-%PRED-PRE: 132 %
FEV1-%Change-Post: 1 %
FEV1-%PRED-PRE: 104 %
FEV1-%Pred-Post: 105 %
FEV1-Post: 2.82 L
FEV1-Pre: 2.77 L
FEV1FVC-%Change-Post: 1 %
FEV1FVC-%PRED-PRE: 105 %
FEV6-%Change-Post: 0 %
FEV6-%PRED-POST: 99 %
FEV6-%Pred-Pre: 98 %
FEV6-Post: 3.19 L
FEV6-Pre: 3.16 L
FEV6FVC-%CHANGE-POST: 0 %
FEV6FVC-%Pred-Post: 102 %
FEV6FVC-%Pred-Pre: 101 %
FVC-%Change-Post: 0 %
FVC-%PRED-POST: 96 %
FVC-%Pred-Pre: 97 %
FVC-PRE: 3.2 L
FVC-Post: 3.19 L
PRE FEV6/FVC RATIO: 100 %
Post FEV1/FVC ratio: 88 %
Post FEV6/FVC ratio: 100 %
Pre FEV1/FVC ratio: 87 %
RV % pred: 100 %
RV: 1.52 L
TLC % pred: 92 %
TLC: 4.26 L

## 2015-07-25 MED ORDER — AZELASTINE-FLUTICASONE 137-50 MCG/ACT NA SUSP: NASAL | Status: DC

## 2015-07-25 NOTE — Progress Notes (Signed)
PFT done today. 

## 2015-07-25 NOTE — Telephone Encounter (Signed)
Ok Rx Dymista nasal spray    # 1,   1-2 puffs each nostril once daily at bedtime     Refill prn

## 2015-07-25 NOTE — Telephone Encounter (Signed)
Pt is aware that we will need to ask CY for Rx of Dymista as we do not see it on her current or past med list. Pt aware we will contact her with response.

## 2015-07-25 NOTE — Telephone Encounter (Signed)
Refill sent in.  Pt aware.  Nothing further needed.  

## 2015-07-29 ENCOUNTER — Telehealth: Payer: Self-pay | Admitting: Internal Medicine

## 2015-07-29 NOTE — Telephone Encounter (Signed)
Patient calling regarding Nebulizer order.  Patient says that she spoke with [facility] and they told her she had to get [place] to send the order to [facility] at fax no. [phone].  Use confirmation number: [id number]  To [facility]

## 2015-07-30 ENCOUNTER — Telehealth: Payer: Self-pay | Admitting: Internal Medicine

## 2015-07-30 NOTE — Telephone Encounter (Signed)
Order reprinted and faxed to [facility] [phone] #[id number] Joellen Jersey

## 2015-07-30 NOTE — Telephone Encounter (Signed)
Notes Recorded by Glean Hess, CMA on 07/30/2015 at 10:25 AM lmtcb. Notes Recorded by Deneise Lever, MD on 07/26/2015 at 9:16 AM Pulmonary function test were completely normal --  I spoke with patient about results and she verbalized understanding and had no questions

## 2015-08-06 ENCOUNTER — Telehealth: Payer: Self-pay | Admitting: Internal Medicine

## 2015-08-06 NOTE — Telephone Encounter (Addendum)
Called [facility] Romans and spoke to [place]. Gregary Signs stated they have tried numerous times to reach her about the nebulizer machine but have been successful. Pt will need to call [facility] at [phone].  lmtcb for pt.

## 2015-08-07 ENCOUNTER — Encounter (HOSPITAL_COMMUNITY): Payer: Managed Care, Other (non HMO) | Attending: Hematology & Oncology | Admitting: Hematology & Oncology

## 2015-08-07 ENCOUNTER — Encounter (HOSPITAL_BASED_OUTPATIENT_CLINIC_OR_DEPARTMENT_OTHER): Payer: Managed Care, Other (non HMO)

## 2015-08-07 VITALS — BP 140/75 | HR 80 | Temp 98.5°F | Resp 14 | Wt 116.2 lb

## 2015-08-07 DIAGNOSIS — Z20828 Contact with and (suspected) exposure to other viral communicable diseases: Secondary | ICD-10-CM | POA: Insufficient documentation

## 2015-08-07 DIAGNOSIS — D500 Iron deficiency anemia secondary to blood loss (chronic): Secondary | ICD-10-CM

## 2015-08-07 DIAGNOSIS — R5382 Chronic fatigue, unspecified: Secondary | ICD-10-CM | POA: Insufficient documentation

## 2015-08-07 DIAGNOSIS — H04123 Dry eye syndrome of bilateral lacrimal glands: Secondary | ICD-10-CM

## 2015-08-07 LAB — IRON AND TIBC
Iron: 120 ug/dL (ref 28–170)
SATURATION RATIOS: 31 % (ref 10.4–31.8)
TIBC: 389 ug/dL (ref 250–450)
UIBC: 269 ug/dL

## 2015-08-07 LAB — CBC WITH DIFFERENTIAL/PLATELET
BASOS ABS: 0.1 10*3/uL (ref 0.0–0.1)
BASOS PCT: 1 %
Eosinophils Absolute: 0.1 10*3/uL (ref 0.0–0.7)
Eosinophils Relative: 2 %
HEMATOCRIT: 39.4 % (ref 36.0–46.0)
HEMOGLOBIN: 13 g/dL (ref 12.0–15.0)
LYMPHS PCT: 27 %
Lymphs Abs: 1.9 10*3/uL (ref 0.7–4.0)
MCH: 30.7 pg (ref 26.0–34.0)
MCHC: 33 g/dL (ref 30.0–36.0)
MCV: 92.9 fL (ref 78.0–100.0)
MONO ABS: 0.5 10*3/uL (ref 0.1–1.0)
Monocytes Relative: 8 %
NEUTROS ABS: 4.3 10*3/uL (ref 1.7–7.7)
NEUTROS PCT: 62 %
Platelets: 317 10*3/uL (ref 150–400)
RBC: 4.24 MIL/uL (ref 3.87–5.11)
RDW: 13.6 % (ref 11.5–15.5)
WBC: 6.8 10*3/uL (ref 4.0–10.5)

## 2015-08-07 LAB — COMPREHENSIVE METABOLIC PANEL
ALBUMIN: 4.1 g/dL (ref 3.5–5.0)
ALT: 20 U/L (ref 14–54)
AST: 22 U/L (ref 15–41)
Alkaline Phosphatase: 52 U/L (ref 38–126)
Anion gap: 6 (ref 5–15)
BUN: 15 mg/dL (ref 6–20)
CHLORIDE: 106 mmol/L (ref 101–111)
CO2: 27 mmol/L (ref 22–32)
CREATININE: 0.85 mg/dL (ref 0.44–1.00)
Calcium: 8.7 mg/dL — ABNORMAL LOW (ref 8.9–10.3)
GFR calc Af Amer: >60 mL/min (ref >60)
GFR calc non Af Amer: >60 mL/min (ref >60)
GLUCOSE: 96 mg/dL (ref 65–99)
POTASSIUM: 3.8 mmol/L (ref 3.5–5.1)
Sodium: 139 mmol/L (ref 135–145)
TOTAL PROTEIN: 7.7 g/dL (ref 6.5–8.1)
Total Bilirubin: 0.7 mg/dL (ref 0.3–1.2)

## 2015-08-07 LAB — FERRITIN: Ferritin: 76 ng/mL (ref 11–307)

## 2015-08-07 LAB — SEDIMENTATION RATE: Sed Rate: 0 mm/hr (ref 0–22)

## 2015-08-07 NOTE — Progress Notes (Signed)
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 female returns for follow-up of severe iron deficiency related to heavy menstrual cycles.   Mrs. Holtzclaw is here alone today. She confirms that she's been tolerating her oral iron  well, with minimal issues. She says sometimes she misses a pill or two, but that's about it.  Today, she says that something is "off" in her neck. She notes that it feels as if someone is "squeezing her throat," as if it's swollen, and that it usually feels difficult to swallow (though not so much today). She currently has no explanation as to what is causing this sensation.  She also complains a lot about intermittent cramping in her shoulders, legs, etc.  She goes to [facility] and typically sees Delman Cheadle. Mrs. Feigenbaum states that she has tried to see them several times this past week, as well as the week before, but each time she waited about two hours and could not get back to see them before she had to leave the clinic to pick up her kids.  She saw her endocrinologist recently, and is due to see her again either this week or the next.  She has an appointment with Dr. Benjamine Mola on Monday about her current HENT symptoms.  With regards to her lifestyle, she hasn't been able to keep up with the habits she had in the past (her exercise routine, weight lifting, nutrition). She states that she feels like she did back when she had "Mono", several years ago.  MEDICAL HISTORY: Past Medical History  Diagnosis Date  . Chronic rhinosinusitis   . Allergic rhinitis   . Cough   . Cardiac murmur   . HSV infection     History of-outbreak during pregnancy 2004  . Mono exposure     Had in January  . Thrush, oral   . Yeast infection of the vagina   . Mononucleosis 01/06/2012     has SINUSITIS, ACUTE; RHINOSINUSITIS, RECURRENT; Seasonal and perennial allergic rhinitis; CARDIAC MURMUR; COUGH; Thrush; Allergic-infective asthma; Dysphagia; GERD (gastroesophageal reflux disease); Anemia; Family history of colonic polyps; Mononucleosis; Colon adenomas; Dysphagia; Fatigue; Abdominal pain, epigastric; IDA (iron deficiency anemia); Anal or rectal pain; Dry mouth; Rectal bleeding; and AP (abdominal pain) on her problem list.     is allergic to sulfonamide derivatives.  Ms. [name] [name] had no medications administered during this visit.  SURGICAL HISTORY: Past Surgical History  Procedure Laterality Date  . Cesarean section  2002,2004,2008, 2009  . Nasal sinus surgery  2005  . Colonoscopy  12/25/2011    Dr. Oneida Alar: small internal hemorrhoids, adenomas, surveillance in 2018  . Esophagogastroduodenoscopy  Feb 2013    Dr. Oneida Alar: normal stomach, duodenum, esophagus. Gastritis. hiatal hernia  . Flexible sigmoidoscopy N/A 02/11/2015    Procedure: FLEXIBLE SIGMOIDOSCOPY;  Surgeon: Danie Binder, MD;  [facility];  Service: Endoscopy;  Laterality: N/A;  1045  . Esophagogastroduodenoscopy N/A 02/11/2015    Procedure: ESOPHAGOGASTRODUODENOSCOPY (EGD);  Surgeon: Danie Binder, MD;  Location: [facility];  Service: Endoscopy;  Laterality: N/A;  . Hemorrhoid banding N/A 02/11/2015    Procedure: HEMORRHOID BANDING;  Surgeon: Danie Binder, MD;  Location: [facility];  Service: Endoscopy;  Laterality: N/A;  . Givens capsule study N/A 03/20/2015    Procedure: GIVENS CAPSULE STUDY;  Surgeon: Carlyon Prows  Rexene Edison, MD;  Location: [facility];  Service: Endoscopy;  Laterality: N/A;  0700    SOCIAL HISTORY: Social History   Social History  . Marital Status: Married    Spouse Name: N/A  . Number of Children: 4  . Years of Education: N/A   Occupational History  . Housewife-recent accounting degree.    Social History Main Topics  . Smoking status: Never Smoker   . Smokeless  tobacco: Never Used  . Alcohol Use: No  . Drug Use: No  . Sexual Activity: Yes   Other Topics Concern  . Not on file   Social History Narrative    FAMILY HISTORY: Family History  Problem Relation Age of Onset  . Allergies Sister   . Allergies Brother   . Cervical cancer Mother   . Colon polyps Mother 41    several "benign" polyps  . Cancer Mother   . Breast cancer      grandmother  . Colon cancer Neg Hx   . Cancer Maternal Grandmother     Review of Systems  Constitutional: Negative for malaise/fatigue. Negative for fever, chills and weight loss.  HENT: Negative for congestion, hearing loss, nosebleeds, sore throat and tinnitus.  She reports having strange redness/symptoms in the back of her throat.   Eyes: Negative for blurred vision, double vision, pain and discharge.  Respiratory: Negative for cough, hemoptysis, sputum production, shortness of breath and wheezing.   Cardiovascular: Negative for chest pain, palpitations, claudication, leg swelling and PND.  Gastrointestinal: Negative for heartburn, nausea, vomiting, abdominal pain, diarrhea, constipation, blood in stool and melena.  Genitourinary: Negative for dysuria, urgency, frequency and hematuria.  Musculoskeletal: Negative for myalgias, joint pain and falls.  Skin: Negative for itching and rash.  Neurological: Negative for dizziness, tingling, tremors, sensory change, speech change, focal weakness, seizures, loss of consciousness, weakness and headaches.  Endo/Heme/Allergies: Does not bruise/bleed easily.  Psychiatric/Behavioral: Negative for depression, suicidal ideas, memory loss and substance abuse. The patient is not nervous/anxious and does not have insomnia.   14 point review of systems was performed and is negative except as detailed under history of present illness and above   PHYSICAL EXAMINATION  ECOG PERFORMANCE STATUS: 0 - Asymptomatic  Filed Vitals:   08/07/15 1018  BP: 140/75  Pulse: 80  Temp:  98.5 F (36.9 C)  Resp: 14    Physical Exam  Constitutional: She is oriented to person, place, and time and well-developed, well-nourished, and in no distress.  HENT:  Head: Normocephalic and atraumatic.  Nose: Nose normal.  Mouth/Throat: Oropharynx is clear and moist. No oropharyngeal exudate.  Eyes: Conjunctivae and EOM are normal. Pupils are equal, round, and reactive to light. Right eye exhibits no discharge. Left eye exhibits no discharge. No scleral icterus.  Neck: Normal range of motion. Neck supple. No tracheal deviation present. No thyromegaly present.  Cardiovascular: Normal rate, regular rhythm and normal heart sounds.  Exam reveals no gallop and no friction rub.   No murmur heard. Pulmonary/Chest: Effort normal and breath sounds normal. She has no wheezes. She has no rales.  Abdominal: Soft. Bowel sounds are normal. She exhibits no distension and no mass. There is no tenderness. There is no rebound and no guarding.  Musculoskeletal: Normal range of motion. She exhibits no edema.  Lymphadenopathy:    She has no cervical adenopathy.  Neurological: She is alert and oriented to person, place, and time. She has normal reflexes. No cranial nerve deficit. Gait normal. Coordination normal.  Skin:  Skin is warm and dry. No rash noted.  Psychiatric: Mood, memory, affect and judgment normal.  Nursing note and vitals reviewed.   LABORATORY DATA: I have reviewed the data as listed.  CBC    Component Value Date/Time   WBC 6.8 08/07/2015 1022   RBC 4.24 08/07/2015 1022   RBC 3.97 12/05/2013 1500   HGB 13.0 08/07/2015 1022   HCT 39.4 08/07/2015 1022   PLT 317 08/07/2015 1022   MCV 92.9 08/07/2015 1022   MCH 30.7 08/07/2015 1022   MCHC 33.0 08/07/2015 1022   RDW 13.6 08/07/2015 1022   LYMPHSABS 1.9 08/07/2015 1022   MONOABS 0.5 08/07/2015 1022   EOSABS 0.1 08/07/2015 1022   BASOSABS 0.1 08/07/2015 1022   CMP     Component Value Date/Time   NA 139 08/07/2015 1022   K 3.8  08/07/2015 1022   CL 106 08/07/2015 1022   CO2 27 08/07/2015 1022   GLUCOSE 96 08/07/2015 1022   BUN 15 08/07/2015 1022   CREATININE 0.85 08/07/2015 1022   CREATININE 0.72 12/25/2014 1005   CALCIUM 8.7* 08/07/2015 1022   PROT 7.7 08/07/2015 1022   ALBUMIN 4.1 08/07/2015 1022   AST 22 08/07/2015 1022   ALT 20 08/07/2015 1022   ALKPHOS 52 08/07/2015 1022   BILITOT 0.7 08/07/2015 1022   GFRNONAA >60 08/07/2015 1022   GFRAA >60 08/07/2015 1022    ASSESSMENT and THERAPY PLAN:   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 She had difficulties with itching after her last IV iron infusion. We have tried her on oral Niferex and she is able to tolerate this on a fairly regular basis. She still has intermittent GI discomfort. I advised her I will call her when her ferritin level is available. Should she need additional iron we discussed other iron formulations.   Laboratory studies with her and encouraged her to take calcium supplementation and consider over-the-counter potassium.   I have checked and EBV chronic/active panel for her. We will advise her of the results.  She is currently very distressed with how she feels.  All questions were answered. The patient knows to call the clinic with any problems, questions or concerns. We can certainly see the patient much sooner if necessary.   Follow up, 3 months. We discussed moving her visits out to 6 month intervals with 3 month labs checks including CBC and ferritin.   Orders Placed This Encounter  Procedures  . EBV, Chronic/Active Infection    This note was electronically signed.  This document serves as a record of services personally performed by Ancil Linsey, MD. It was created on her behalf by Toni Amend, a trained medical scribe. The creation of this record is based on the scribe's personal observations and the  provider's statements to them. This document has been checked and approved by the attending provider.  I have reviewed the above documentation for accuracy and completeness, and I agree with the above.  Kelby Fam. Whitney Muse, MD

## 2015-08-07 NOTE — Telephone Encounter (Signed)
Informed pt to contact [place].  Pt will call us back if there is an issue.  Nothing further was needed at this time.

## 2015-08-07 NOTE — Patient Instructions (Signed)
..  [facility] at [facility] Discharge Instructions  RECOMMENDATIONS MADE BY THE CONSULTANT AND ANY TEST RESULTS WILL BE SENT TO YOUR REFERRING PHYSICIAN.  Return in three months for labs and follow up appointment.  Please see schedule for appointment dates and times.    Thank you for choosing [place] at [facility] to provide your oncology and hematology care.  To afford each patient quality time with our provider, please arrive at least 15 minutes before your scheduled appointment time.    You need to re-schedule your appointment should you arrive 10 or more minutes late.  We strive to give you quality time with our providers, and arriving late affects you and other patients whose appointments are after yours.  Also, if you no show three or more times for appointments you may be dismissed from the clinic at the providers discretion.     Again, thank you for choosing [facility].  Our hope is that these requests will decrease the amount of time that you wait before being seen by our physicians.       _____________________________________________________________  Should you have questions after your visit to [facility], please contact our office at [phone] between the hours of 8:30 a.m. and 4:30 p.m.  Voicemails left after 4:30 p.m. will not be returned until the following business day.  For prescription refill requests, have your pharmacy contact our office.

## 2015-08-08 ENCOUNTER — Ambulatory Visit: Payer: Managed Care, Other (non HMO) | Admitting: Internal Medicine

## 2015-08-08 ENCOUNTER — Encounter (HOSPITAL_COMMUNITY): Payer: Self-pay | Admitting: Hematology & Oncology

## 2015-08-08 LAB — MISC LABCORP TEST (SEND OUT): Labcorp test code: 10280

## 2015-08-08 NOTE — Progress Notes (Signed)
Labs drawn

## 2015-08-13 ENCOUNTER — Ambulatory Visit (HOSPITAL_COMMUNITY): Payer: Managed Care, Other (non HMO) | Admitting: Hematology & Oncology

## 2015-08-13 ENCOUNTER — Other Ambulatory Visit (HOSPITAL_COMMUNITY): Payer: Managed Care, Other (non HMO)

## 2015-08-15 ENCOUNTER — Encounter (HOSPITAL_COMMUNITY): Payer: Self-pay | Admitting: Hematology & Oncology

## 2015-08-19 ENCOUNTER — Telehealth: Payer: Self-pay | Admitting: Internal Medicine

## 2015-08-19 NOTE — Telephone Encounter (Signed)
Patient returning Elise's phone call.  [phone].

## 2015-08-19 NOTE — Telephone Encounter (Signed)
Spoke with pt, states she is moving and had lost her allergy vaccine vials, they were in her car door for approx. 1 week.  Pt wants to know if these vials are salvageable, or if she needs to have new vials mixed.  CY please advise.  Thanks!

## 2015-08-19 NOTE — Telephone Encounter (Signed)
Called this afternoon and left her a message on her machine. CDY said"the vials are good as long as they didn' get really hot." I advised her to hold the vials up to the light and if their not clear, to please call us, we would have to send her new vials. Cloudy no good, throw away.

## 2015-08-19 NOTE — Telephone Encounter (Signed)
Those vials should still be good, assuming they didn't get really hot.

## 2015-08-19 NOTE — Telephone Encounter (Signed)
lmtcb for pt.  

## 2015-08-20 NOTE — Telephone Encounter (Signed)
[name] [name] called me back after 9pm last night. She said that the vaccine wasn't completely clear and it looked like something was floating in it.(Crystalized) I returned her call this morning and advised her we would need to new vials.(lmomcb) Pt. Has not called me back yet. Waiting on CB.

## 2015-08-21 NOTE — Telephone Encounter (Signed)
Pt. Called last night and left a message after 7. She asked me to go ahead and remix the vac.. Vac. Is made up and in the mail.

## 2015-08-22 ENCOUNTER — Ambulatory Visit (INDEPENDENT_AMBULATORY_CARE_PROVIDER_SITE_OTHER): Payer: Managed Care, Other (non HMO)

## 2015-08-22 ENCOUNTER — Telehealth: Payer: Self-pay | Admitting: Internal Medicine

## 2015-08-22 DIAGNOSIS — J309 Allergic rhinitis, unspecified: Secondary | ICD-10-CM | POA: Diagnosis not present

## 2015-08-22 NOTE — Telephone Encounter (Signed)
Allergy Serum Extract Date Mixed: 08/22/15 Vial: 1 Strength: 1:10 Here/Mail/Pick Up: mail Mixed By: Laurette Schimke

## 2015-09-21 ENCOUNTER — Other Ambulatory Visit: Payer: Self-pay | Admitting: Internal Medicine

## 2015-09-25 ENCOUNTER — Telehealth: Payer: Self-pay | Admitting: Internal Medicine

## 2015-09-25 NOTE — Telephone Encounter (Signed)
No results at my desk; we have records scanned in stating patient did not do the ONO from [facility] on 09-09-15; Please contact [facility] to confirm ONO was done and have them fax to [place] for review. Thanks.

## 2015-09-25 NOTE — Telephone Encounter (Signed)
Patient states that she did an ONO through [place] and has not heard the results of that test yet. Joellen Jersey - have you received these results or do I need to call [facility] to get the results?  Please advise.

## 2015-09-25 NOTE — Telephone Encounter (Signed)
Spoke with Helana at [facility] and she will fax ONO results again  Will hold in triage until results recieved

## 2015-09-26 ENCOUNTER — Encounter: Payer: Self-pay | Admitting: Internal Medicine

## 2015-09-27 NOTE — Telephone Encounter (Signed)
Katie, have you received ONO results on this pt? Please advise. Thanks

## 2015-09-27 NOTE — Telephone Encounter (Signed)
Results have been scanned into Epic Please have Dr. Annamaria Boots review and let us know what to advise patient.

## 2015-09-27 NOTE — Telephone Encounter (Signed)
Sorry, I have not. Please have them re-fax. Thanks.

## 2015-09-30 ENCOUNTER — Other Ambulatory Visit (HOSPITAL_COMMUNITY): Payer: Self-pay

## 2015-09-30 DIAGNOSIS — D509 Iron deficiency anemia, unspecified: Secondary | ICD-10-CM

## 2015-09-30 NOTE — Telephone Encounter (Signed)
Overnight oximetry scores were normal. She does not need home oxygen at night.

## 2015-09-30 NOTE — Telephone Encounter (Signed)
Patient notified of ONO results. No questions or concerns at this time. Nothing further needed.Closing encounter

## 2015-10-28 ENCOUNTER — Other Ambulatory Visit (HOSPITAL_COMMUNITY): Payer: Self-pay

## 2015-10-28 DIAGNOSIS — D509 Iron deficiency anemia, unspecified: Secondary | ICD-10-CM

## 2015-11-01 ENCOUNTER — Telehealth: Payer: Self-pay | Admitting: Internal Medicine

## 2015-11-01 NOTE — Telephone Encounter (Signed)
Called spoke with pt. She reports for the past month she feels like she might had bronchitis.  C/o spasms in lungs, prod cough (sticky, clear/brown/yellow phlem), chest tx, increase SOB w/ exertion. No wheezing, no f/c/s/n/v.  Pt would like to be seen next week. Please advise Dr. Annamaria Boots thanks  Allergies  Allergen Reactions  . Sulfonamide Derivatives Anaphylaxis     Current Outpatient Prescriptions on File Prior to Visit  Medication Sig Dispense Refill  . albuterol (PROVENTIL HFA) 108 (90 BASE) MCG/ACT inhaler Inhale 2 puffs into the lungs 4 (four) times daily as needed. For shortness of breath    . albuterol (PROVENTIL) (2.5 MG/3ML) 0.083% nebulizer solution INHALE 1 VIAL IN NEBULIZER FOUR TIMES DAILY AS NEEDED FOR SHORTNESS OF BREATH 150 mL 2  . amoxicillin-clavulanate (AUGMENTIN) 875-125 MG per tablet Take 1 tablet by mouth 2 (two) times daily. 14 tablet 0  . Azelastine-Fluticasone 137-50 MCG/ACT SUSP 1-2 puffs each nostril once daily at bedtime 23 g prn  . azithromycin (ZITHROMAX) 250 MG tablet   0  . B Complex-C (SUPER B COMPLEX PO) Take 1 tablet by mouth daily.    . beclomethasone (QVAR) 80 MCG/ACT inhaler Inhale 2 puffs into the lungs 2 (two) times daily. 1 Inhaler 6  . cetirizine (ZYRTEC) 10 MG tablet Take 10 mg by mouth daily.      . diphenhydrAMINE (BENADRYL) 25 mg capsule Take 25 mg by mouth at bedtime as needed for sleep.    Marland Kitchen EPINEPHrine (EPI-PEN) 0.3 mg/0.3 mL SOAJ injection Inject 0.3 mLs (0.3 mg total) into the muscle once. 1 Device 0  . fluconazole (DIFLUCAN) 100 MG tablet Take 2 tablets on Day I and then 1 tablet daily for 3 days. 5 tablet 0  . fluticasone (FLONASE) 50 MCG/ACT nasal spray Place 2 sprays into both nostrils daily.    Marland Kitchen guaiFENesin (MUCINEX) 600 MG 12 hr tablet Take 1,200 mg by mouth 2 (two) times daily as needed for cough.     . Iron Polysacch Cmplx-B12-FA 150-0.025-1 MG CAPS 1 capsule daily. 30 each 5  . levothyroxine (SYNTHROID, LEVOTHROID) 25 MCG tablet  Take 25 mcg by mouth daily before breakfast.     . LO LOESTRIN FE 1 MG-10 MCG / 10 MCG tablet     . montelukast (SINGULAIR) 10 MG tablet Take 1 tablet (10 mg total) by mouth at bedtime. 30 tablet 6  . NONFORMULARY OR COMPOUNDED ITEM Allergy Vaccine 1:10 Given at Home    . norethindrone-ethinyl estradiol (MICROGESTIN,JUNEL,LOESTRIN) 1-20 MG-MCG tablet Take 1 tablet by mouth daily.    . Prenat w/o A-FeCbGl-DSS-FA-DHA (CITRANATAL ASSURE) 35-1 & 300 MG tablet TK 1 T AND 1 C PO D UTD  11  . QVAR 80 MCG/ACT inhaler INHALE 2 PUFFS BY MOUTH TWICE DAILY 8.7 g 0  . RABEprazole (ACIPHEX) 20 MG tablet Take 20 mg by mouth daily.     . RABEprazole (ACIPHEX) 20 MG tablet TAKE 1 TABLET BY MOUTH TWICE DAILY 60 tablet 5  . RESTASIS 0.05 % ophthalmic emulsion     . valACYclovir (VALTREX) 1000 MG tablet      No current facility-administered medications on file prior to visit.

## 2015-11-01 NOTE — Telephone Encounter (Signed)
Ok to find a held-spot of mine for next week, h=or see if TP can see her.

## 2015-11-01 NOTE — Telephone Encounter (Signed)
LMTCB x 1 

## 2015-11-04 NOTE — Telephone Encounter (Signed)
LMTCB x 2 for pt  Per CY, can schedule appt in any HELD slot this week.

## 2015-11-04 NOTE — Telephone Encounter (Signed)
Spoke with the pt and scheduled 11:30 am appt with CDY for 11/05/15

## 2015-11-04 NOTE — Telephone Encounter (Signed)
Patient Returned call   [phone]

## 2015-11-05 ENCOUNTER — Ambulatory Visit (INDEPENDENT_AMBULATORY_CARE_PROVIDER_SITE_OTHER): Payer: Managed Care, Other (non HMO) | Admitting: Internal Medicine

## 2015-11-05 ENCOUNTER — Other Ambulatory Visit (INDEPENDENT_AMBULATORY_CARE_PROVIDER_SITE_OTHER): Payer: Managed Care, Other (non HMO)

## 2015-11-05 ENCOUNTER — Encounter: Payer: Self-pay | Admitting: Internal Medicine

## 2015-11-05 VITALS — BP 110/72 | HR 100 | Ht 60.0 in | Wt 119.8 lb

## 2015-11-05 DIAGNOSIS — J45998 Other asthma: Secondary | ICD-10-CM | POA: Diagnosis not present

## 2015-11-05 DIAGNOSIS — R0600 Dyspnea, unspecified: Secondary | ICD-10-CM

## 2015-11-05 LAB — CBC WITH DIFFERENTIAL/PLATELET
BASOS ABS: 0.1 10*3/uL (ref 0.0–0.1)
BASOS PCT: 1.1 % (ref 0.0–3.0)
EOS ABS: 0.1 10*3/uL (ref 0.0–0.7)
Eosinophils Relative: 1.1 % (ref 0.0–5.0)
HEMATOCRIT: 36.2 % (ref 36.0–46.0)
Hemoglobin: 12 g/dL (ref 12.0–15.0)
LYMPHS ABS: 1.3 10*3/uL (ref 0.7–4.0)
Lymphocytes Relative: 25.7 % (ref 12.0–46.0)
MCHC: 33.1 g/dL (ref 30.0–36.0)
MCV: 90.5 fl (ref 78.0–100.0)
MONO ABS: 0.4 10*3/uL (ref 0.1–1.0)
Monocytes Relative: 7.8 % (ref 3.0–12.0)
NEUTROS ABS: 3.2 10*3/uL (ref 1.4–7.7)
Neutrophils Relative %: 64.3 % (ref 43.0–77.0)
PLATELETS: 365 10*3/uL (ref 150.0–400.0)
RBC: 4 Mil/uL (ref 3.87–5.11)
RDW: 13.9 % (ref 11.5–15.5)
WBC: 4.9 10*3/uL (ref 4.0–10.5)

## 2015-11-05 LAB — NITRIC OXIDE: Nitric Oxide: 12

## 2015-11-05 NOTE — Progress Notes (Signed)
10/14/11- 9 yoF never smoker followed for allergic rhinitis, rhinosinusitis.  PCP Mickie Hillier LOV- 06/19/10 She comes alone this visit but has many small children and is considering having another. Has had flu vaccine. Did well through the summer. With onset of early fall weather, began noting more of thick mucus. Frequent colds cycle through her family. She doesn't know when her mild sense of head and chest congestion may be from a viral infection. She now has a nebulizer machine with albuterol. This helps "some, a little". Sticky sensation doesn't go away. Can occasionally cough up a little white phlegm. Mucinex helps. We discussed the importance of hydration as the indoor heat comes on and she admits she probably does not drink enough fluid.  06/09/12- 63 yoF never smoker followed for allergic rhinitis, rhinosinusitis. Asthma       PCP Mickie Hillier Pt states having some chest congestion,productive cough,sob,denies any wheezing. Pt states still  taking allergy injections at home . She comes today with 4 small children. She feels as if she is "breathing through a straw" at times but never notices wheezing. A few colds. Occasional cough. Throat feels "sticky". She uses rescue inhaler before exercise. Recognizes no problems with her allergy vaccine.  05/17/13- 88 yoF never smoker followed for allergic rhinitis, rhinosinusitis. Asthma, complicated by hypothyroid, hiatal hernia     PCP Mickie Hillier No antihistamines, OTC cough syrups, or OTC sleep aids in past 3 days. Has been on allergy vaccine 1:10. Concerned that tight feeling in throat with much postnasal drip this spring, increased nasal congestion all may indicate need to retest. Allergy skin testing 05/17/13- significant positives especially for wheeze and tree pollens, cat. This is a significant pattern change since her original testing.  05/17/14- 46 yoF never smoker followed for allergic rhinitis, rhinosinusitis. Asthma, complicated by hypothyroid,  hiatal hernia     PCP [place]: Had sinus surgery 01-2014/ Dr Benjamine Mola; throat feels puffy since May 2015-comes and goes Back on Allergy vaccine1:10 GO for 2-3 months. She has a second dog now, and several children as potential sources of virus infections Recently finished amoxicillin for ? Sinusitis, without effect Says acid blocker does not affect heartburn/globus sensation. Complains again of a "stickiness" sensation in her airways.  09/17/14- 46 yoF never smoker followed for allergic rhinitis, rhinosinusitis. Asthma, complicated by hypothyroid, hiatal hernia     PCP Mickie Hillier FOLLOWS FOR: Patient recovering from walking pneumo. She is still having sob, and chest tightness. Patient denies wheezing. No CXR was done. She is "mostly better" after levaquin 10 days, but still some chest tightness. She asks about some predniosne to help clear chest. Denies fever, purulent sputum, nodes.  03/18/15- 75 yoF never smoker followed for allergic rhinitis, rhinosinusitis. Asthma, complicated by hypothyroid, hiatal hernia     PCP Mickie Hillier Here with small, active children Follows RDE:YCXKG some; SOB at all times when it happens; runny nose; Uses nebulizer once each night, continues Qvar and rarely uses rescue inhaler. Iron deficiency was treated with IV iron replacement which caused itching, now resolved. CXR 09/24/14 FINDINGS: Cardiomediastinal silhouette is stable. No acute infiltrate or pleural effusion. No pulmonary edema. Minimal degenerative changes mid thoracic spine. IMPRESSION: No active cardiopulmonary disease. Electronically Signed  By: Lahoma Crocker M.D.  //? Time to update pft?//  04/19/15- 42 yoF never smoker followed for allergic rhinitis, rhinosinusitis. Asthma, complicated by hypothyroid, hiatal hernia     PCP Mickie Hillier Here with small, active children ACUTE VISIT: Pt states she feels  like her lungs are achy, having a sore throat, and sinus drainage;feels like she is  swollen. Uncomfortable for 2 weeks, minimal dry cough, no wheeze, much postnasal drainage with clear mucus. Throat feels "swollen". Head GI endoscopy and was told she might have Sjogren's, asking [place] to check labs.  11/05/2015-44 year old female never smoker followed for allergic rhinitis, rhinosinusitis, asthma, complicated by hypothyroid, hiatal hernia Allergy vaccine 1:10 FOLLOWS FOR: Pt reports good tolerance of vaccines. Pt c/o increased white/brown/green mucus production, chest discomfort, SOB and congestion x 6 weeks. Pt also notes some sinus congestion with discolored mucus.  PFT 07/25/2015-WNL Describes "tight feeling" across anterior chest, worse in the evenings but present most days. Worse if she has been lifting weights that day. Continues to exercise most days. Exacerbation over the last 6 weeks. Might have begun with a cold but symptoms really limited. Using her home nebulizer up to 3 times daily "some help".  ROS-see HPI Constitutional:   No-   weight loss, night sweats, fevers, chills, fatigue, lassitude. HEENT:   No-  headaches, difficulty swallowing, tooth/dental problems, sore throat,       No-  sneezing, itching, ear ache, +nasal congestion, +post nasal drip,  CV:  No-   chest pain, orthopnea, PND, swelling in lower extremities, anasarca, dizziness, palpitations Resp: + tightness/ shortness of breath with exertion or at rest.              No-   productive cough,  + non-productive cough,  No- coughing up of blood.              No-   change in color of mucus.  No- wheezing.   Skin: No-   rash or lesions. GI:  No-   heartburn, indigestion, abdominal pain, nausea, vomiting,  GU:  MS:  No-   joint pain or swelling.   Neuro-     nothing unusual Psych:  No- change in mood or affect. No depression or anxiety.  No memory loss.  OBJ General- Alert, Oriented, Affect-appropriate, Distress- none acute, muscular Skin- rash-none, lesions- none, excoriation- none Lymphadenopathy-  none Head- atraumatic            Eyes- Gross vision intact, PERRLA, conjunctivae clear secretions            Ears- Hearing, canals-normal            Nose- Clear, no-Septal dev, mucus, polyps, erosion, perforation             Throat- Mallampati II , mucosa normal-not significantly dry , drainage- none, tonsils- atrophic, voice normal Neck- flexible , trachea midline, no stridor , thyroid nl, carotid no bruit Chest - symmetrical excursion , unlabored           Heart/CV- RRR , 1/6 systolic murmur , no gallop  , no rub, nl s1 s2                           - JVD- none , edema- none, stasis changes- none, varices- none           Lung- clear to P&A, wheeze- none, cough- none , dullness-none, rub- none           Chest wall-  Abd-  Br/ Gen/ Rectal- Not done, not indicated Extrem- cyanosis- none, clubbing, none, atrophy- none, strength- nl Neuro- grossly intact to observation

## 2015-11-05 NOTE — Patient Instructions (Addendum)
Order- Water engineer     Inhale deeply once or twice per set, 2-3 sets daily  Order- lab  IgE total, CBC w diff       Dx dyspnea   FENO      Sample Anoro Ellipta inhaler  Inhale 1 puff, once daily maintenance to see if it helps

## 2015-11-06 ENCOUNTER — Telehealth: Payer: Self-pay | Admitting: Internal Medicine

## 2015-11-06 ENCOUNTER — Other Ambulatory Visit (HOSPITAL_COMMUNITY): Payer: Managed Care, Other (non HMO)

## 2015-11-06 ENCOUNTER — Ambulatory Visit (HOSPITAL_COMMUNITY): Payer: Managed Care, Other (non HMO) | Admitting: Hematology & Oncology

## 2015-11-06 LAB — IGE: IGE (IMMUNOGLOBULIN E), SERUM: 31 kU/L (ref <115)

## 2015-11-06 NOTE — Assessment & Plan Note (Signed)
There might be some mild intermittent well-controlled asthma but I suspect much of her chest discomfort is chest wall, related to her weight lifting. Plan-incentive spirometer, lab for IgE and CBC for eosinophil count. Try treating chest with heat, ibuprofen. Tried changing from weight lifting to more aerobic exercise.

## 2015-11-06 NOTE — Telephone Encounter (Signed)
LVM for patient to return call. 

## 2015-11-07 NOTE — Telephone Encounter (Signed)
lmtcb X2 for pt.  

## 2015-11-08 ENCOUNTER — Telehealth: Payer: Self-pay | Admitting: *Deleted

## 2015-11-08 NOTE — Telephone Encounter (Signed)
LMOMTCB x1 for pt 

## 2015-11-08 NOTE — Telephone Encounter (Signed)
-----   Message from [facility] sent at 11/08/2015  9:43 AM EST ----- She can bring the cracked one back to you and you can exchange it for a new one. She will not need to be charged but I'll need the cracked one back. Thanks  ----- Message -----    From: Inge Rise, CMA    Sent: 11/07/2015   9:22 AM      To: Darlina Guys  This patient was given an incentive spirometer on 12/21. She reports their is a crack in the tube piece and is not functioning properly. Can we replace this for pt for free or will this be charged to her?

## 2015-11-08 NOTE — Telephone Encounter (Signed)
Advised patient to bring back cracked incentive spirometer.   Advised patient that I would leave a new incentive spirometer at the front for her to pick up to replace the broken one at no charge per [facility] at [facility] Form left at front for patient to sign for new spirometer.  Also, instructions left on bag for patient to drop off broken one. Nothing further needed.

## 2015-11-08 NOTE — Telephone Encounter (Signed)
Called and left message on patient's voicemail advising her that it appears that she had spoken with [place] regarding her results.  Advised her to call our office if she has any further questions regarding her lab results. Nothing further needed.   Notes Recorded by Inge Rise, [place] on 11/07/2015 at 9:23 AM I spoke with patient about results and she verbalized understanding and had no questions Notes Recorded by Deneise Lever, MD on 11/07/2015 at 9:13 AM Blood counts and IgE allergy antibody level are within normal limits

## 2015-11-12 ENCOUNTER — Telehealth: Payer: Self-pay | Admitting: Internal Medicine

## 2015-11-12 NOTE — Telephone Encounter (Signed)
[facility] [place] [facility]

## 2015-11-12 NOTE — Telephone Encounter (Signed)
lmtcb x1 for pt. 

## 2015-11-13 MED ORDER — UMECLIDINIUM-VILANTEROL 62.5-25 MCG/INH IN AEPB: 1.0000 | INHALATION_SPRAY | Freq: Every day | RESPIRATORY_TRACT | Status: DC

## 2015-11-13 MED ORDER — MONTELUKAST SODIUM 10 MG PO TABS: 10.0000 mg | ORAL_TABLET | Freq: Every day | ORAL | Status: AC

## 2015-11-13 MED ORDER — BECLOMETHASONE DIPROPIONATE 80 MCG/ACT IN AERS: 2.0000 | INHALATION_SPRAY | Freq: Two times a day (BID) | RESPIRATORY_TRACT | Status: DC

## 2015-11-13 MED ORDER — AZELASTINE-FLUTICASONE 137-50 MCG/ACT NA SUSP: 1.0000 | Freq: Every day | NASAL | Status: DC

## 2015-11-13 MED ORDER — CETIRIZINE HCL 10 MG PO TABS: 10.0000 mg | ORAL_TABLET | Freq: Every day | ORAL | Status: AC

## 2015-11-13 NOTE — Telephone Encounter (Signed)
Pt returning call, let her know that refills have been sent.[name] [name]

## 2015-11-13 NOTE — Telephone Encounter (Signed)
Last ov with CY on 11/05/15  Patient Instructions       Order- printed script Incentive Spirometer     Inhale deeply once or twice per set, 2-3 sets daily  Order- lab  IgE total, CBC w diff       Dx dyspnea   FENO      Sample Anoro Ellipta inhaler  Inhale 1 puff, once daily maintenance to see if it helps     Patient returned asking for Anoro, Qvar, singular, zyrtec, and dymista refilled. She also would like to ask CY if she could start giving herself her allergy shots and is requesting a rx sent in for allergy needles. I explained I would send a message to CY. [facility] [place]. Pt voiced understanding and had no further questions.   CY Please advise if okay to refill meds and send rx for allergy needles  Anoro 1 puff daily Given sample at ov with 11/05/15  Qvar 2 puffs BID Last refilled 05/22/15 #1 with  0 refills  Singular 10mg  1 tab at bedtime Last refilled 11/19/14 #30 with 6 refills  Zyrtec 10mg  1 tab daily Last refilled 10/13/11  Dymista 1-2 puffs daily at bedtime Given on 07/25/15 #1 with 0 refills   Allergies  Allergen Reactions  . Sulfonamide Derivatives Anaphylaxis     Current outpatient prescriptions:  .  albuterol (PROVENTIL HFA) 108 (90 BASE) MCG/ACT inhaler, Inhale 2 puffs into the lungs 4 (four) times daily as needed. For shortness of breath, Disp: , Rfl:  .  albuterol (PROVENTIL) (2.5 MG/3ML) 0.083% nebulizer solution, INHALE 1 VIAL IN NEBULIZER FOUR TIMES DAILY AS NEEDED FOR SHORTNESS OF BREATH, Disp: 150 mL, Rfl: 2 .  Azelastine-Fluticasone 137-50 MCG/ACT SUSP, 1-2 puffs each nostril once daily at bedtime, Disp: 23 g, Rfl: prn .  B Complex-C (SUPER B COMPLEX PO), Take 1 tablet by mouth daily., Disp: , Rfl:  .  beclomethasone (QVAR) 80 MCG/ACT inhaler, Inhale 2 puffs into the lungs 2 (two) times daily., Disp: 1 Inhaler, Rfl: 6 .  cetirizine (ZYRTEC) 10 MG tablet, Take 10 mg by mouth daily.  , Disp: , Rfl:  .  diphenhydrAMINE (BENADRYL) 25  mg capsule, Take 25 mg by mouth at bedtime as needed for sleep., Disp: , Rfl:  .  EPINEPHrine (EPI-PEN) 0.3 mg/0.3 mL SOAJ injection, Inject 0.3 mLs (0.3 mg total) into the muscle once., Disp: 1 Device, Rfl: 0 .  fluconazole (DIFLUCAN) 100 MG tablet, Take 2 tablets on Day I and then 1 tablet daily for 3 days., Disp: 5 tablet, Rfl: 0 .  fluticasone (FLONASE) 50 MCG/ACT nasal spray, Place 2 sprays into both nostrils daily., Disp: , Rfl:  .  guaiFENesin (MUCINEX) 600 MG 12 hr tablet, Take 1,200 mg by mouth 2 (two) times daily as needed for cough. , Disp: , Rfl:  .  Iron Polysacch Cmplx-B12-FA 150-0.025-1 MG CAPS, 1 capsule daily., Disp: 30 each, Rfl: 5 .  levothyroxine (SYNTHROID, LEVOTHROID) 25 MCG tablet, Take 25 mcg by mouth daily before breakfast. , Disp: , Rfl:  .  LO LOESTRIN FE 1 MG-10 MCG / 10 MCG tablet, , Disp: , Rfl:  .  montelukast (SINGULAIR) 10 MG tablet, Take 1 tablet (10 mg total) by mouth at bedtime., Disp: 30 tablet, Rfl: 6 .  NONFORMULARY OR COMPOUNDED ITEM, Allergy Vaccine 1:10 Given at Home, Disp: , Rfl:  .  norethindrone-ethinyl estradiol (MICROGESTIN,JUNEL,LOESTRIN) 1-20 MG-MCG tablet, Take 1 tablet by mouth daily., Disp: , Rfl:  .  Prenat w/o A-FeCbGl-DSS-FA-DHA (CITRANATAL ASSURE) 35-1 & 300 MG tablet, TK 1 T AND 1 C PO D UTD, Disp: , Rfl: 11 .  RABEprazole (ACIPHEX) 20 MG tablet, Take 20 mg by mouth daily. , Disp: , Rfl:  .  RESTASIS 0.05 % ophthalmic emulsion, , Disp: , Rfl:  .  valACYclovir (VALTREX) 1000 MG tablet, , Disp: , Rfl:

## 2015-11-13 NOTE — Telephone Encounter (Signed)
Ok 1 year refills on the meds.   Sorry, but we can no longer let patients start giving their own allergy shots. Insurance and the FDA wanted that stopped. If her primary care office is willing to give her shots, then we can have the allergy lab contact them to help set that up

## 2015-11-13 NOTE — Telephone Encounter (Signed)
LMTCB x 1 

## 2015-11-13 NOTE — Telephone Encounter (Signed)
LMTCB x2  

## 2015-11-13 NOTE — Telephone Encounter (Signed)
Patient Returned call  [phone]

## 2015-11-13 NOTE — Telephone Encounter (Signed)
LVM for patient to return call to inform her that [facility] [place] and to explain to her that CY will not send rx for allergy shots.

## 2015-11-19 ENCOUNTER — Telehealth: Payer: Self-pay | Admitting: Internal Medicine

## 2015-11-19 DIAGNOSIS — J45998 Other asthma: Secondary | ICD-10-CM

## 2015-11-19 NOTE — Telephone Encounter (Signed)
Pt states that she does not have a CPAP machine. Pt is needing neb tubing order placed to DME. Nebulizer machine is okay, motor is working fine. Pt states that she does not need a machine just the neb supplies. Order placed for neb tubing. Nothing further needed.

## 2015-11-19 NOTE — Telephone Encounter (Signed)
Left message for patient to call back  

## 2015-11-19 NOTE — Telephone Encounter (Signed)
She needs to show her machine to [place].  If they can't fix it, then ok to order replacement for broken CPAP. Current pressure settings, mask of choice, humidifier, supplies, AirView  For dx OSA

## 2015-11-19 NOTE — Telephone Encounter (Signed)
DME: [employer] Patient says that she pulled out the tubing from her neb machine and the piece that attaches the tubing to the machine broke off of the machine, so now she has no way to attach her tubing. She is requesting an order for a new neb machine.  Dr. Annamaria Boots, ok to order new neb? Please advise.

## 2015-12-17 IMAGING — CT CT ABD-PELV W/ CM
2 of 5 series · 16 of 46 positions shown, 18 images · IV contrast (Omnipaque 300)
Comparison: None.

CLINICAL DATA: Initial encounter for five week history of
epigastric pain and unintentional weight loss.

EXAM:
CT ABDOMEN AND PELVIS WITH CONTRAST
TECHNIQUE: Multidetector CT imaging of the abdomen and pelvis was performed
using the standard protocol following bolus administration of
intravenous contrast.
CONTRAST:  100mL OMNIPAQUE IOHEXOL 300 MG/ML  SOLN

[Series 2: abd_pel_with 5.0 b40f · axial · 0.59mm/px · z∈[-474,-148]mm · 13 of 73 slices shown, 15 images]
[im 4/73  soft-tissue]
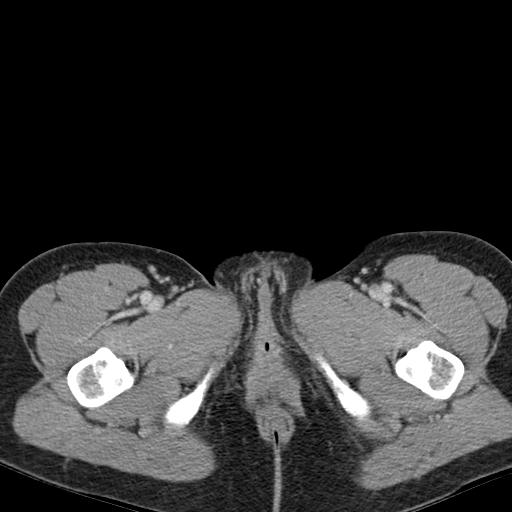
[im 4/73  bone]
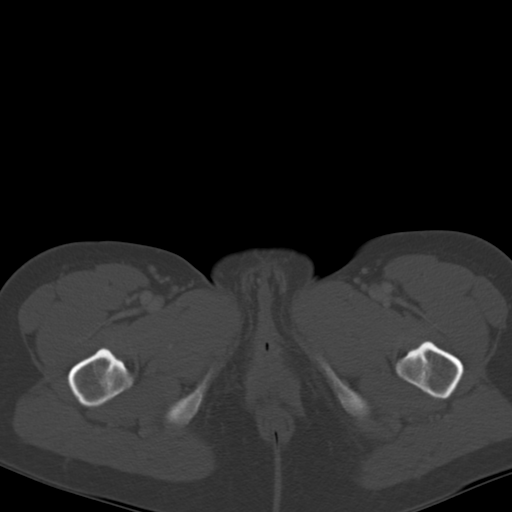
[im 12/73  soft-tissue]
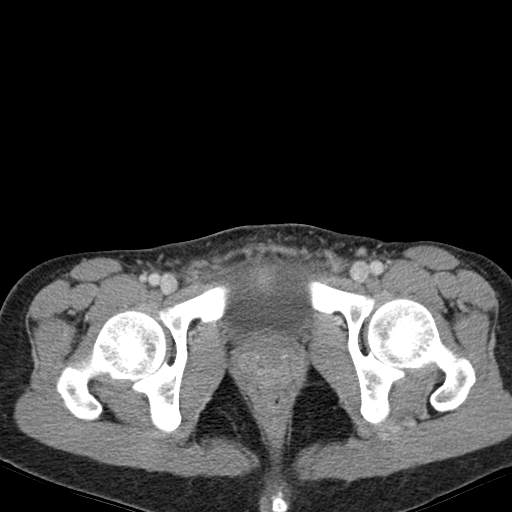
[im 16/73  soft-tissue]
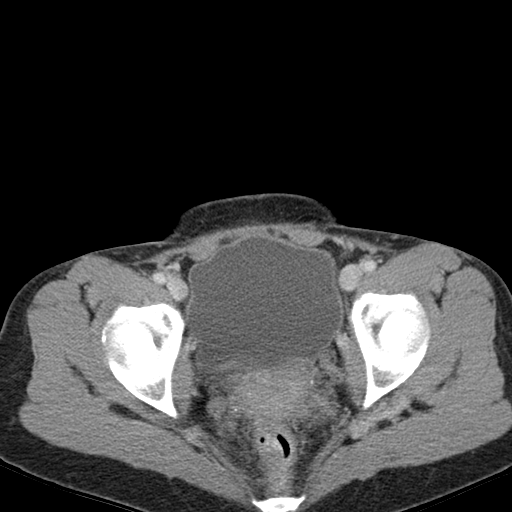
[im 19/73  soft-tissue]
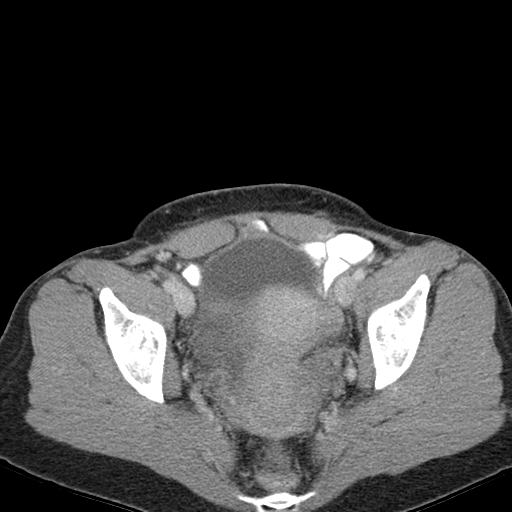
[im 27/73  soft-tissue]
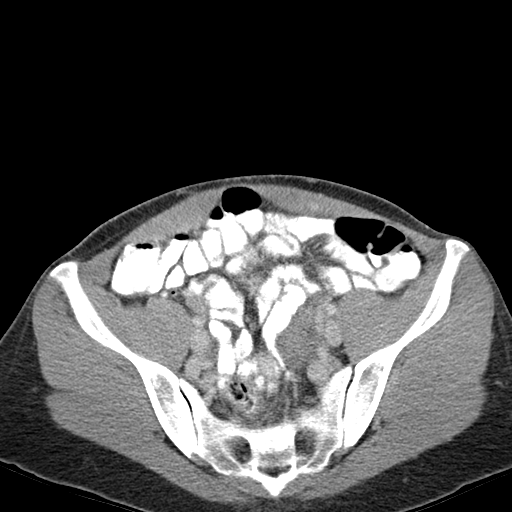
[im 31/73  soft-tissue]
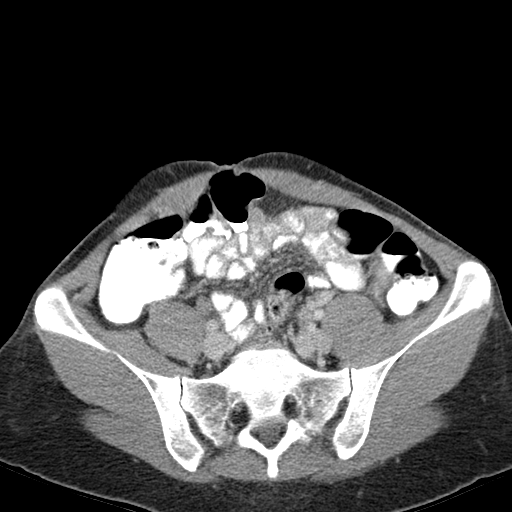
[im 38/73  soft-tissue]
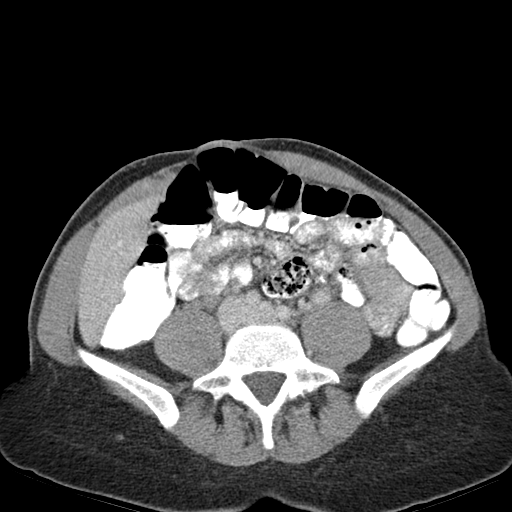
[im 42/73  soft-tissue]
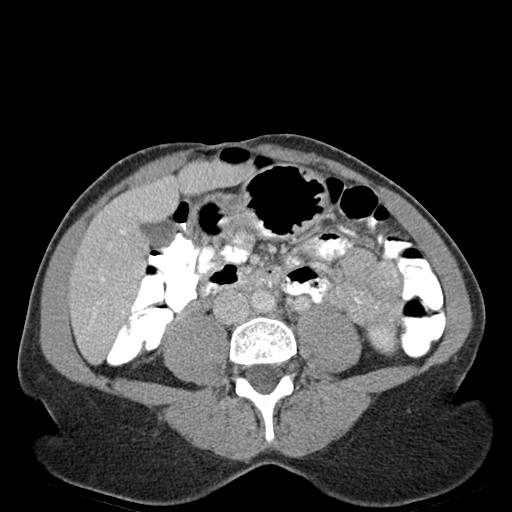
[im 46/73  soft-tissue]
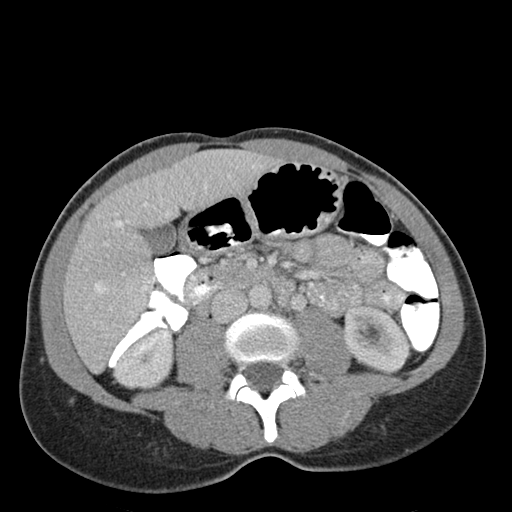
[im 46/73  bone]
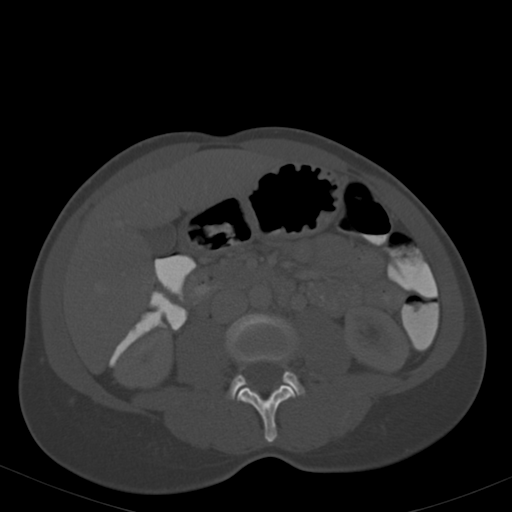
[im 54/73  soft-tissue]
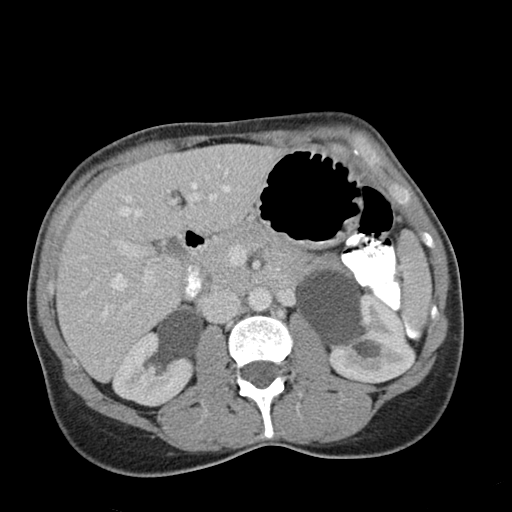
[im 57/73  soft-tissue]
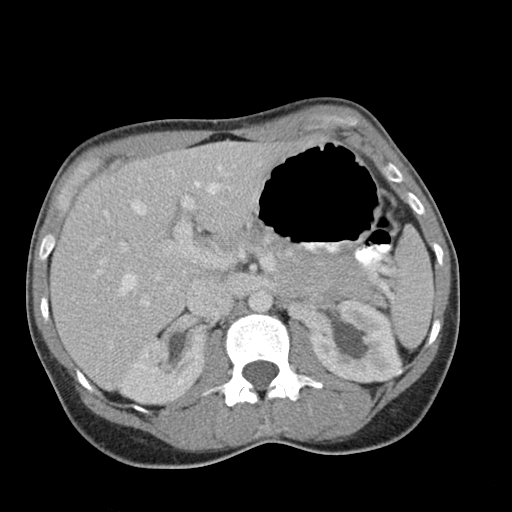
[im 61/73  soft-tissue]
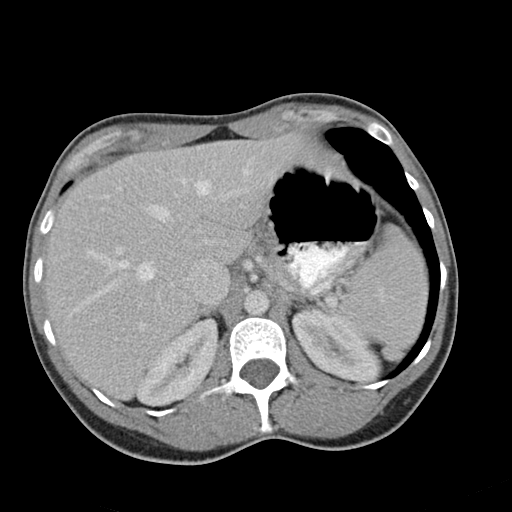
[im 69/73  soft-tissue]
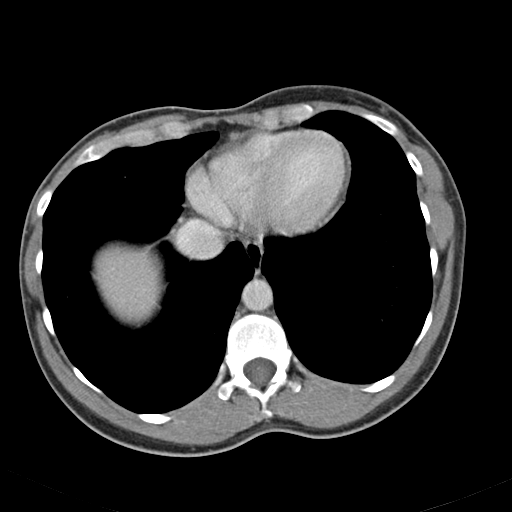

[Series 3: abd_pel_with 3.0 spo cor · coronal · 0.59mm/px · 3 of 82 slices shown]
[im 28/82  soft-tissue]
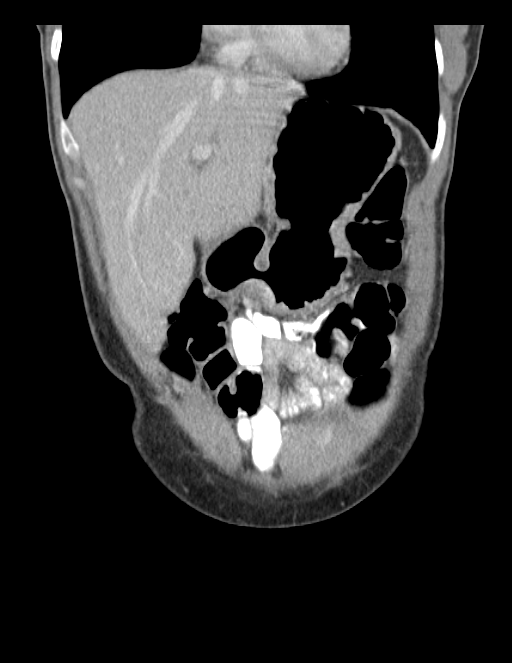
[im 37/82  soft-tissue]
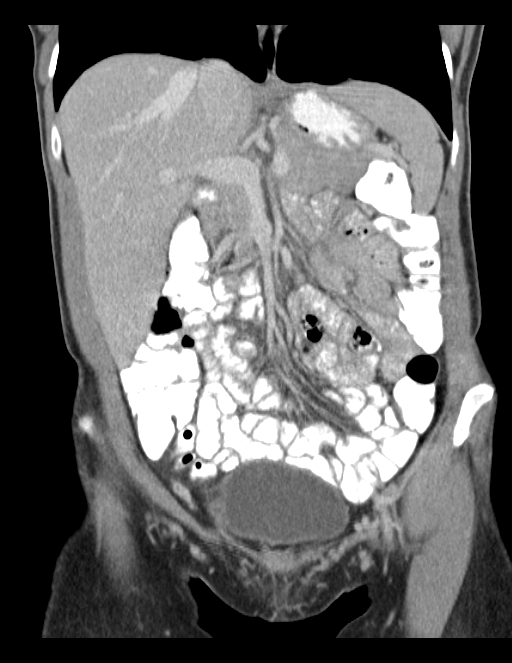
[im 46/82  soft-tissue]
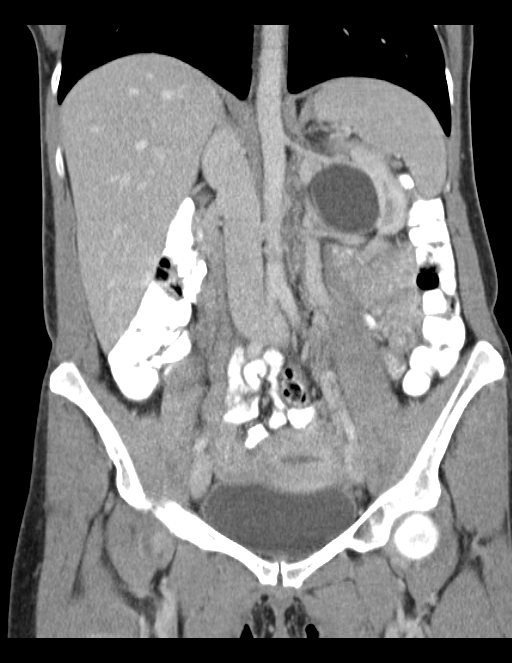

[16 of 46 positions shown; findings below may reference images not displayed]

FINDINGS: Lower chest:  Unremarkable.

Hepatobiliary: No focal abnormality within the liver parenchyma.
There is no evidence for gallstones, gallbladder wall thickening, or
pericholecystic fluid. No intrahepatic or extrahepatic biliary
dilation.

Pancreas: No focal mass lesion. No dilatation of the main duct. No
intraparenchymal cyst. No peripancreatic edema.

Spleen: No splenomegaly. No focal mass lesion.

Adrenals/Urinary Tract: No adrenal nodule or mass. No enhancing
renal mass. There is mild fullness of both intrarenal collecting
systems, left greater than right, with prominent extrarenal pelvis
noted in each kidney. The extrarenal pelvises distended bilaterally.
No evidence for associated hydroureter. No evidence for renal,
ureteral, or bladder stones.

Stomach/Bowel: Stomach is nondistended. No gastric wall thickening.
No evidence of outlet obstruction. Duodenum is normally positioned
as is the ligament of Treitz. No small bowel wall thickening. No
small bowel dilatation. Terminal ileum is normal. Appendix is
normal. No gross colonic mass. No colonic wall thickening. No
substantial diverticular change.

Vascular/Lymphatic: No abdominal aortic aneurysm. There is no
gastrohepatic or hepatoduodenal ligament lymphadenopathy. No
retroperitoneal lymphadenopathy. No pelvic sidewall lymphadenopathy.

Reproductive: Uterus is normal in appearance.  No adnexal mass.

Other: No intraperitoneal free fluid.

Musculoskeletal: Bone windows reveal no worrisome lytic or sclerotic
osseous lesions.
IMPRESSION: No acute findings in the abdomen or pelvis. Specifically, no
evidence to explain the patient's history of epigastric pain and
unintentional weight loss.

## 2016-01-08 ENCOUNTER — Encounter: Payer: Self-pay | Admitting: Internal Medicine

## 2016-01-08 NOTE — Telephone Encounter (Signed)
Incentive Spirometer was never picked up.  Placed back into stock.

## 2016-03-17 ENCOUNTER — Ambulatory Visit: Payer: Managed Care, Other (non HMO) | Admitting: Internal Medicine

## 2016-03-19 ENCOUNTER — Ambulatory Visit: Payer: Managed Care, Other (non HMO) | Admitting: Internal Medicine

## 2016-05-01 ENCOUNTER — Other Ambulatory Visit: Payer: Self-pay | Admitting: Internal Medicine

## 2016-06-11 ENCOUNTER — Telehealth: Payer: Self-pay | Admitting: Internal Medicine

## 2016-06-11 NOTE — Telephone Encounter (Signed)
Pt states that she lives in [place] and currently gets vaccines there.  Pt is moving back to [place] within the last couple months.  Pt interested in transferring her vaccines to Dr Annamaria Boots to start getting them here.  Please advise Dr Annamaria Boots. Thanks.

## 2016-06-11 NOTE — Telephone Encounter (Signed)
We are going to be closing our allergy clinic here as I slow down this winter.  I suggest that she establish with one of the other allergy practices in this area, so she will have better continuity.

## 2016-06-11 NOTE — Telephone Encounter (Signed)
lmtcb x1 for pt. 

## 2016-06-12 NOTE — Telephone Encounter (Signed)
Spoke with pt. She is aware of CY's response. Nothing further was needed. 

## 2016-06-22 ENCOUNTER — Encounter: Payer: Self-pay | Admitting: Internal Medicine

## 2016-06-22 ENCOUNTER — Ambulatory Visit (INDEPENDENT_AMBULATORY_CARE_PROVIDER_SITE_OTHER): Payer: Managed Care, Other (non HMO) | Admitting: Internal Medicine

## 2016-06-22 ENCOUNTER — Telehealth: Payer: Self-pay | Admitting: Internal Medicine

## 2016-06-22 VITALS — BP 116/80 | HR 94 | Ht 60.0 in | Wt 117.6 lb

## 2016-06-22 DIAGNOSIS — M3500 Sicca syndrome, unspecified: Secondary | ICD-10-CM

## 2016-06-22 DIAGNOSIS — J4530 Mild persistent asthma, uncomplicated: Secondary | ICD-10-CM

## 2016-06-22 NOTE — Telephone Encounter (Signed)
lmomtcb x 1 for pt.  Will need to review her pharmacy list.

## 2016-06-22 NOTE — Patient Instructions (Signed)
Order- Peak Flow meter so you can check airflow comparing good and bad days.  Order- schedule  PFT    Dx Sjogrens  Please see if you can get [place] a report and maybe a disk copy of your latest CXR

## 2016-06-22 NOTE — Progress Notes (Signed)
F never smoker followed for allergic rhinitis, rhinosinusitis.    04/19/15- 89 yoF never smoker followed for allergic rhinitis, rhinosinusitis. Asthma, complicated by hypothyroid, hiatal hernia     PCP Mickie Hillier Here with small, active children ACUTE VISIT: Pt states she feels like her lungs are achy, having a sore throat, and sinus drainage;feels like she is swollen. Uncomfortable for 2 weeks, minimal dry cough, no wheeze, much postnasal drainage with clear mucus. Throat feels "swollen". Head GI endoscopy and was told she might have Sjogren's, asking [place] to check labs.  11/05/2015-45 year old female never smoker followed for allergic rhinitis, rhinosinusitis, asthma, complicated by hypothyroid, hiatal hernia Allergy vaccine 1:10 FOLLOWS FOR: Pt reports good tolerance of vaccines. Pt c/o increased white/brown/green mucus production, chest discomfort, SOB and congestion x 6 weeks. Pt also notes some sinus congestion with discolored mucus.  PFT 07/25/2015-WNL Describes "tight feeling" across anterior chest, worse in the evenings but present most days. Worse if she has been lifting weights that day. Continues to exercise most days. Exacerbation over the last 6 weeks. Might have begun with a cold but symptoms really limited. Using her home nebulizer up to 3 times daily "some help".  06/22/2016-45 year old female never smoker followed for Allergic rhinitis, rhinosinusitis, asthma, complicated by hypothyroid, hiatal hernia, Sjogren's FOLLOWS FOR: sob w/exertion & occ at rest & non prod cough. She had moved away for a while and has now moved back. Reports diagnosis of Sjogren's based on lip biopsy by [facility]. Associated with dry eyes and arthralgias. She is about to start allergy shots in [place]. [place] and anticipates transferring that care back to this region when she moves. Using Arnuity inhaler maintenance which seems to help her chronic vague sense of chest tightness and dyspnea.  ROS-see  HPI Constitutional:   No-   weight loss, night sweats, fevers, chills, fatigue, lassitude. HEENT:   No-  headaches, difficulty swallowing, tooth/dental problems, sore throat,       No-  sneezing, itching, ear ache, +nasal congestion, +post nasal drip,  CV:  No-   chest pain, orthopnea, PND, swelling in lower extremities, anasarca, dizziness, palpitations Resp: + tightness/ shortness of breath with exertion or at rest.              No-   productive cough,  + non-productive cough,  No- coughing up of blood.              No-   change in color of mucus.  No- wheezing.   Skin: No-   rash or lesions. GI:  No-   heartburn, indigestion, abdominal pain, nausea, vomiting,  GU:  MS:  No-   joint pain or swelling.   Neuro-     nothing unusual Psych:  No- change in mood or affect. No depression or anxiety.  No memory loss.  OBJ General- Alert, Oriented, Affect-appropriate, Distress- none acute, muscular, unremarkable exam Skin- rash-none, lesions- none, excoriation- none Lymphadenopathy- none Head- atraumatic            Eyes- Gross vision intact, PERRLA, conjunctivae clear secretions            Ears- Hearing, canals-normal            Nose- Clear, no-Septal dev, mucus, polyps, erosion, perforation             Throat- Mallampati II , mucosa normal-not significantly dry , drainage- none, tonsils- atrophic, voice normal Neck- flexible , trachea midline, no stridor , thyroid nl, carotid no bruit Chest - symmetrical  excursion , unlabored           Heart/CV- RRR , 1/6 systolic murmur , no gallop  , no rub, nl s1 s2                           - JVD- none , edema- none, stasis changes- none, varices- none           Lung- clear to P&A, wheeze- none, cough- none , dullness-none, rub- none           Chest wall-  Abd-  Br/ Gen/ Rectal- Not done, not indicated Extrem- cyanosis- none, clubbing, none, atrophy- none, strength- nl Neuro- grossly intact to observation

## 2016-06-23 MED ORDER — ALBUTEROL SULFATE (2.5 MG/3ML) 0.083% IN NEBU: INHALATION_SOLUTION | RESPIRATORY_TRACT | 0 refills | Status: DC

## 2016-06-23 NOTE — Telephone Encounter (Signed)
lmtcb x2 for pt. [facility] is the pharmacy we have listed in her chart.

## 2016-06-23 NOTE — Telephone Encounter (Signed)
Spoke with pt, advised that we have updated her med list.  Pt also requesting neb refills.  This has been sent.  Nothing further needed.

## 2016-07-01 NOTE — Progress Notes (Signed)
[name] [name], [place] [address] Phone: [phone] Subjective:    I'm seeing this patient by the request  of:  ALICE Delailah Spieth WOOD, PA-C   CC: Hurt all over body  [id number]  [name] [name] is a 45 y.o. female coming in with complaint of arthralgia. Patient's past medical history is significant for Sjogren syndrome.  20 years of lifting. Patient is unable to do this anymore secondary to discomfort and pain. Patient states that she is decreasing her activity and is feeling like she is actually getting worse. Patient is wondering if there is anything she can do. Patient states it can be her hands, shoulders, neck, knees and even lower back. Seems to migrate from time to time. At the moment everything seems to just not feel bright. Patient has seen other providers including a rheumatologist and she has been started on Plaquenil. No side effects to the medications and states maybe over the course last 4 weeks she has noticed some mild improvement. Patient has seen other providers for other health issues and no other significant help has been around.      Past Medical History:  Diagnosis Date  . Allergic rhinitis   . Cardiac murmur   . Chronic rhinosinusitis   . Cough   . HSV infection    History of-outbreak during pregnancy 2004  . Mono exposure    Had in January  . Mononucleosis 01/06/2012  . Thrush, oral   . Yeast infection of the vagina    Past Surgical History:  Procedure Laterality Date  . CESAREAN SECTION  2002,2004,2008, 2009  . COLONOSCOPY  12/25/2011   Dr. Oneida Alar: small internal hemorrhoids, adenomas, surveillance in 2018  . ESOPHAGOGASTRODUODENOSCOPY  Feb 2013   Dr. Oneida Alar: normal stomach, duodenum, esophagus. Gastritis. hiatal hernia  . ESOPHAGOGASTRODUODENOSCOPY N/A 02/11/2015   Procedure: ESOPHAGOGASTRODUODENOSCOPY (EGD);  Surgeon: Danie Binder, MD;  [facility];  Service: Endoscopy;  Laterality: N/A;  .  FLEXIBLE SIGMOIDOSCOPY N/A 02/11/2015   Procedure: FLEXIBLE SIGMOIDOSCOPY;  Surgeon: Danie Binder, MD;  [facility];  Service: Endoscopy;  Laterality: N/A;  1045  . GIVENS CAPSULE STUDY N/A 03/20/2015   Procedure: GIVENS CAPSULE STUDY;  Surgeon: Danie Binder, MD;  [facility];  Service: Endoscopy;  Laterality: N/A;  0700  . HEMORRHOID BANDING N/A 02/11/2015   Procedure: HEMORRHOID BANDING;  Surgeon: Danie Binder, MD;  [facility];  Service: Endoscopy;  Laterality: N/A;  . NASAL SINUS SURGERY  2005   Social History   Social History  . Marital status: Married    Spouse name: N/A  . Number of children: 4  . Years of education: N/A   Occupational History  . Housewife-recent accounting degree. Unemployed   Social History Main Topics  . Smoking status: Never Smoker  . Smokeless tobacco: Never Used  . Alcohol use No  . Drug use: No  . Sexual activity: Yes   Other Topics Concern  . None   Social History Narrative  . None   Allergies  Allergen Reactions  . Sulfonamide Derivatives Anaphylaxis   Family History  Problem Relation Age of Onset  . Allergies Sister   . Allergies Brother   . Cervical cancer Mother   . Colon polyps Mother 11    several "benign" polyps  . Cancer Mother   . Breast cancer      grandmother  . Colon cancer Neg Hx   .  Cancer Maternal Grandmother     Past medical history, social, surgical and family history all reviewed in electronic medical record.  No pertanent information unless stated regarding to the chief complaint.   Review of Systems: No visual changes, nausea, vomiting, diarrhea, constipation, dizziness, abdominal pain, skin rash, fevers, chills, night sweats,  , chest pain, shortness of breath, positive for joint pains, muscle soreness, muscle swelling, weight loss and weight gain that seems to fluctuate, headaches  Objective  Blood pressure 130/74, pulse 91, height 5' (1.524 m), weight 118 lb (53.5 kg),  SpO2 99 %.  General: No apparent distress alert and oriented x3 mood and affect normal, dressed appropriately.  HEENT: Pupils equal, extraocular movements intact  Respiratory: Patient's speak in full sentences and does not appear short of breath  Cardiovascular: No lower extremity edema, non tender, no erythema  Skin: Warm dry intact with no signs of infection or rash on extremities or on axial skeleton.  Abdomen: Soft nontender  Neuro: Cranial nerves II through XII are intact, neurovascularly intact in all extremities with 2+ DTRs and 2+ pulses.  Lymph: No lymphadenopathy of posterior or anterior cervical chain or axillae bilaterally.  Gait normal with good balance and coordination.  MSK:  Non tender with full range of motion and good stability and symmetric strength and tone of shoulders, elbows, wrist, hip, knee and ankles bilaterally. Mild discomfort diffusely mostly over the joints  No specific findings today.  Osteopathic findings C2 flexed rotated and side bent right T3 extended rotated and side bent left with inhaled third rib L2 flexed rotated and side bent right Sacrum left on left   Impression and Recommendations:     This case required medical decision making of moderate complexity.      Note: This dictation was prepared with Dragon dictation along with smaller phrase technology. Any transcriptional errors that result from this process are unintentional.

## 2016-07-02 ENCOUNTER — Encounter: Payer: Self-pay | Admitting: Family Medicine

## 2016-07-02 ENCOUNTER — Ambulatory Visit (INDEPENDENT_AMBULATORY_CARE_PROVIDER_SITE_OTHER): Payer: Managed Care, Other (non HMO) | Admitting: Family Medicine

## 2016-07-02 ENCOUNTER — Other Ambulatory Visit (INDEPENDENT_AMBULATORY_CARE_PROVIDER_SITE_OTHER): Payer: Managed Care, Other (non HMO)

## 2016-07-02 VITALS — BP 130/74 | HR 91 | Ht 60.0 in | Wt 118.0 lb

## 2016-07-02 DIAGNOSIS — M2550 Pain in unspecified joint: Secondary | ICD-10-CM | POA: Diagnosis not present

## 2016-07-02 DIAGNOSIS — Q871 Congenital malformation syndromes predominantly associated with short stature: Secondary | ICD-10-CM | POA: Diagnosis not present

## 2016-07-02 DIAGNOSIS — D509 Iron deficiency anemia, unspecified: Secondary | ICD-10-CM

## 2016-07-02 DIAGNOSIS — M9903 Segmental and somatic dysfunction of lumbar region: Secondary | ICD-10-CM | POA: Diagnosis not present

## 2016-07-02 DIAGNOSIS — M9902 Segmental and somatic dysfunction of thoracic region: Secondary | ICD-10-CM

## 2016-07-02 DIAGNOSIS — M9904 Segmental and somatic dysfunction of sacral region: Secondary | ICD-10-CM | POA: Diagnosis not present

## 2016-07-02 DIAGNOSIS — M999 Biomechanical lesion, unspecified: Secondary | ICD-10-CM | POA: Insufficient documentation

## 2016-07-02 DIAGNOSIS — Q8719 Other congenital malformation syndromes predominantly associated with short stature: Secondary | ICD-10-CM | POA: Insufficient documentation

## 2016-07-02 LAB — CBC WITH DIFFERENTIAL/PLATELET
Basophils Absolute: 0.1 10*3/uL (ref 0.0–0.1)
Basophils Relative: 1 % (ref 0.0–3.0)
Eosinophils Absolute: 0.1 10*3/uL (ref 0.0–0.7)
Eosinophils Relative: 1.1 % (ref 0.0–5.0)
HCT: 35.7 % — ABNORMAL LOW (ref 36.0–46.0)
Hemoglobin: 12.1 g/dL (ref 12.0–15.0)
LYMPHS ABS: 1.5 10*3/uL (ref 0.7–4.0)
LYMPHS PCT: 18.9 % (ref 12.0–46.0)
MCHC: 33.8 g/dL (ref 30.0–36.0)
MCV: 89.7 fl (ref 78.0–100.0)
Monocytes Absolute: 0.5 10*3/uL (ref 0.1–1.0)
Monocytes Relative: 5.7 % (ref 3.0–12.0)
NEUTROS ABS: 5.9 10*3/uL (ref 1.4–7.7)
NEUTROS PCT: 73.3 % (ref 43.0–77.0)
PLATELETS: 332 10*3/uL (ref 150.0–400.0)
RBC: 3.99 Mil/uL (ref 3.87–5.11)
RDW: 13.7 % (ref 11.5–15.5)
WBC: 8.1 10*3/uL (ref 4.0–10.5)

## 2016-07-02 LAB — SEDIMENTATION RATE: Sed Rate: 15 mm/hr (ref 0–20)

## 2016-07-02 LAB — C-REACTIVE PROTEIN

## 2016-07-02 LAB — TSH: TSH: 1.9 u[IU]/mL (ref 0.35–4.50)

## 2016-07-02 LAB — FERRITIN: FERRITIN: 24.5 ng/mL (ref 10.0–291.0)

## 2016-07-02 LAB — T4, FREE: Free T4: 0.75 ng/dL (ref 0.60–1.60)

## 2016-07-02 LAB — CORTISOL: CORTISOL PLASMA: 10.1 ug/dL

## 2016-07-02 LAB — T3, FREE: T3, Free: 3.2 pg/mL (ref 2.3–4.2)

## 2016-07-02 LAB — VITAMIN D 25 HYDROXY (VIT D DEFICIENCY, FRACTURES): VITD: 25.96 ng/mL — ABNORMAL LOW (ref 30.00–100.00)

## 2016-07-02 MED ORDER — MELOXICAM 15 MG PO TABS: 15.0000 mg | ORAL_TABLET | Freq: Every day | ORAL | 0 refills | Status: DC

## 2016-07-02 MED ORDER — VITAMIN D (ERGOCALCIFEROL) 1.25 MG (50000 UNIT) PO CAPS: 50000.0000 [IU] | ORAL_CAPSULE | ORAL | 0 refills | Status: DC

## 2016-07-02 NOTE — Assessment & Plan Note (Signed)
Decision today to treat with OMT was based on Physical Exam  After verbal consent patient was treated with HVLA, ME,  techniques in thoracic, lumbar and sacral areas  Patient tolerated the procedure well with improvement in symptoms  Patient given exercises, stretches and lifestyle modifications  See medications in patient instructions if given  Patient will follow up in 4 weeks

## 2016-07-02 NOTE — Patient Instructions (Addendum)
Good to see you  We will get labs Ice 20 minutes 2 times daily. Usually after activity and before bed. Once weekly vitamin D for next 12 weeks Meloxicam daily for 10 days then as needed Stay active but no heavy lifting for now. Biking, elliptical, and swimming Over the counter I want you to get Turmeric 500mg  twice daily.  Watch your stomach Tart cherry extract at night any dose.  For food make sure 60-80 grams of protein daily  Stay hydrated.  More omega 3 foods and less omega 6 foods See me again in 4 weeks and we will try manipulation again.

## 2016-07-02 NOTE — Assessment & Plan Note (Signed)
On supplements at this moment.

## 2016-07-02 NOTE — Assessment & Plan Note (Signed)
Patient is more of a Sjogren syndrome. I do think that this is likely contributing to most of her aches and pains. We did attempt osteopathic manipulation, and patient will try some over-the-counter medications, we discussed icing regimen. Labs taken today for further evaluation. We discussed which activities to avoid as well as dietary changes can be beneficial. Follow-up again in 4 weeks for further evaluation and treatment.

## 2016-07-05 NOTE — Assessment & Plan Note (Signed)
Breath sounds are clear, heart sounds normal and she looks very comfortable. She had a recent chest x-ray elsewhere, not available, but says it was clear. Now using a maintenance steroid inhaler and starting allergy shots again. I explained we would be closing our allergy clinic and recommended she find another allergy practice for transfer of her shot therapy when she moves. Plan-schedule PFT for documentation given interval diagnosis of Sjogren's

## 2016-07-09 ENCOUNTER — Telehealth: Payer: Self-pay

## 2016-07-09 NOTE — Telephone Encounter (Signed)
OV notes faxed  

## 2016-07-09 NOTE — Telephone Encounter (Signed)
Patient is requesting that her lab works and office notes from last visit was sent to her doctor in [place].  Fax : [phone] (Dr. Nelda Marseille)  Thank you.

## 2016-07-11 LAB — DHEA: DHEA: 115 ng/dL (ref 102–1185)

## 2016-07-27 ENCOUNTER — Telehealth: Payer: Self-pay | Admitting: Internal Medicine

## 2016-07-27 MED ORDER — ALBUTEROL SULFATE (2.5 MG/3ML) 0.083% IN NEBU: INHALATION_SOLUTION | RESPIRATORY_TRACT | 0 refills | Status: AC

## 2016-07-27 NOTE — Telephone Encounter (Signed)
Spoke with pt and she is requesting to have albuterol rx changed to 90D supply as this copay is cheaper for her. Rx changed and sent. Nothing further needed.

## 2016-07-29 ENCOUNTER — Other Ambulatory Visit: Payer: Self-pay | Admitting: Family Medicine

## 2016-07-29 NOTE — Telephone Encounter (Signed)
Refill done.  

## 2016-07-29 NOTE — Progress Notes (Signed)
[name] [address] [place], [place] [address] Phone: [phone] Subjective:    I'm seeing this patient by the request  of:  ALICE Christine Morton WOOD, PA-C   CC: arthralgia f/u  [id number]  [name] [name] is a 45 y.o. female coming in with complaint of arthralgia. Patient's past medical history is significant for Sjogren syndrome.   Patient was seen and was having increasing pain and multiple different areas. Attempted osteopathic manipulation, given once weekly vitamin D, and has been more active. Patient states that she is feeling better overall. Feels that she has more energy. Also is feeling very strong. Very happy with the results so far. Does feel that the manipulation was helpful.      Past Medical History:  Diagnosis Date  . Allergic rhinitis   . Cardiac murmur   . Chronic rhinosinusitis   . Cough   . HSV infection    History of-outbreak during pregnancy 2004  . Mono exposure    Had in January  . Mononucleosis 01/06/2012  . Thrush, oral   . Yeast infection of the vagina    Past Surgical History:  Procedure Laterality Date  . CESAREAN SECTION  2002,2004,2008, 2009  . COLONOSCOPY  12/25/2011   Dr. Oneida Alar: small internal hemorrhoids, adenomas, surveillance in 2018  . ESOPHAGOGASTRODUODENOSCOPY  Feb 2013   Dr. Oneida Alar: normal stomach, duodenum, esophagus. Gastritis. hiatal hernia  . ESOPHAGOGASTRODUODENOSCOPY N/A 02/11/2015   Procedure: ESOPHAGOGASTRODUODENOSCOPY (EGD);  Surgeon: Danie Binder, MD;  [facility];  Service: Endoscopy;  Laterality: N/A;  . FLEXIBLE SIGMOIDOSCOPY N/A 02/11/2015   Procedure: FLEXIBLE SIGMOIDOSCOPY;  Surgeon: Danie Binder, MD;  [facility];  Service: Endoscopy;  Laterality: N/A;  1045  . GIVENS CAPSULE STUDY N/A 03/20/2015   Procedure: GIVENS CAPSULE STUDY;  Surgeon: Danie Binder, MD;  [facility];  Service: Endoscopy;  Laterality: N/A;  0700  . HEMORRHOID BANDING N/A  02/11/2015   Procedure: HEMORRHOID BANDING;  Surgeon: Danie Binder, MD;  [facility];  Service: Endoscopy;  Laterality: N/A;  . NASAL SINUS SURGERY  2005   Social History   Social History  . Marital status: Married    Spouse name: N/A  . Number of children: 4  . Years of education: N/A   Occupational History  . Housewife-recent accounting degree. Unemployed   Social History Main Topics  . Smoking status: Never Smoker  . Smokeless tobacco: Never Used  . Alcohol use No  . Drug use: No  . Sexual activity: Yes   Other Topics Concern  . Not on file   Social History Narrative  . No narrative on file   Allergies  Allergen Reactions  . Sulfonamide Derivatives Anaphylaxis   Family History  Problem Relation Age of Onset  . Allergies Sister   . Allergies Brother   . Cervical cancer Mother   . Colon polyps Mother 14    several "benign" polyps  . Cancer Mother   . Breast cancer      grandmother  . Colon cancer Neg Hx   . Cancer Maternal Grandmother     Past medical history, social, surgical and family history all reviewed in electronic medical record.  No pertanent information unless stated regarding to the chief complaint.   Review of Systems: No visual changes, nausea, vomiting, diarrhea, constipation, dizziness, abdominal pain, skin rash, fevers, chills, night sweats,  , chest pain, shortness of breath, positive for joint pains,  muscle soreness, muscle swelling, weight loss and weight gain that seems to fluctuate, headaches  Objective  There were no vitals taken for this visit.  General: No apparent distress alert and oriented x3 mood and affect normal, dressed appropriately.  HEENT: Pupils equal, extraocular movements intact  Respiratory: Patient's speak in full sentences and does not appear short of breath  Cardiovascular: No lower extremity edema, non tender, no erythema  Skin: Warm dry intact with no signs of infection or rash on extremities or on axial  skeleton.  Abdomen: Soft nontender  Neuro: Cranial nerves II through XII are intact, neurovascularly intact in all extremities with 2+ DTRs and 2+ pulses.  Lymph: No lymphadenopathy of posterior or anterior cervical chain or axillae bilaterally.  Gait normal with good balance and coordination.  MSK:  Non tender with full range of motion and good stability and symmetric strength and tone of shoulders, elbows, wrist, hip, knee and ankles bilaterally. Mild discomfort diffusely mostly over the joints  No specific findings today.  Osteopathic findings C2 flexed rotated and side bent right C4 flexed rotated and side bent left T3 extended rotated and side bent left with inhaled third rib L2 flexed rotated and side bent right Sacrum left on left   Impression and Recommendations:     This case required medical decision making of moderate complexity.      Note: This dictation was prepared with Dragon dictation along with smaller phrase technology. Any transcriptional errors that result from this process are unintentional.

## 2016-07-30 ENCOUNTER — Ambulatory Visit (INDEPENDENT_AMBULATORY_CARE_PROVIDER_SITE_OTHER): Payer: Managed Care, Other (non HMO) | Admitting: Family Medicine

## 2016-07-30 ENCOUNTER — Encounter: Payer: Self-pay | Admitting: Family Medicine

## 2016-07-30 VITALS — BP 116/78 | HR 95 | Wt 119.0 lb

## 2016-07-30 DIAGNOSIS — Q871 Congenital malformation syndromes predominantly associated with short stature: Secondary | ICD-10-CM | POA: Diagnosis not present

## 2016-07-30 DIAGNOSIS — M9904 Segmental and somatic dysfunction of sacral region: Secondary | ICD-10-CM | POA: Diagnosis not present

## 2016-07-30 DIAGNOSIS — M999 Biomechanical lesion, unspecified: Secondary | ICD-10-CM

## 2016-07-30 DIAGNOSIS — M9903 Segmental and somatic dysfunction of lumbar region: Secondary | ICD-10-CM | POA: Diagnosis not present

## 2016-07-30 DIAGNOSIS — M9902 Segmental and somatic dysfunction of thoracic region: Secondary | ICD-10-CM

## 2016-07-30 DIAGNOSIS — Q8719 Other congenital malformation syndromes predominantly associated with short stature: Secondary | ICD-10-CM

## 2016-07-30 DIAGNOSIS — D509 Iron deficiency anemia, unspecified: Secondary | ICD-10-CM

## 2016-07-30 NOTE — Assessment & Plan Note (Signed)
Decision today to treat with OMT was based on Physical Exam  After verbal consent patient was treated with HVLA, ME,  techniques in thoracic, lumbar and sacral areas  Patient tolerated the procedure well with improvement in symptoms  Patient given exercises, stretches and lifestyle modifications  See medications in patient instructions if given  Patient will follow up in 5 weeks

## 2016-07-30 NOTE — Assessment & Plan Note (Signed)
I do believe that a significant amount of the inflammation and pain can be secondary to the autoimmune disease. We discussed with patient again at great length. We discussed avoiding certain activities as well as doing certain activities. We discussed over-the-counter medications. We discussed the importance of the vitamin D supplementation. Responds well to osteopathic manipulation and will follow-up again in 5-6 weeks for further evaluation and treatment.

## 2016-07-30 NOTE — Assessment & Plan Note (Signed)
Current supplementation.

## 2016-07-30 NOTE — Patient Instructions (Signed)
Good to see you  Ice is your friend Turmeric 500mg  twice daily  Keep working on the posture, you are doing great  Consider tart cherry extract any dose at night Stay hydrated Wendee Beavers sport is a good supplement.  See me again in 5-6 weeks.

## 2016-08-16 ENCOUNTER — Other Ambulatory Visit: Payer: Self-pay | Admitting: Family Medicine

## 2016-08-17 ENCOUNTER — Telehealth: Payer: Self-pay | Admitting: *Deleted

## 2016-08-17 ENCOUNTER — Other Ambulatory Visit: Payer: Self-pay | Admitting: Family Medicine

## 2016-08-17 NOTE — Telephone Encounter (Signed)
Pt left msg on triage stating wanting to ask Dr. Tamala Julian since he check her thyroid labs wanting to see would he refill her Levothyroxine...[name] [name]

## 2016-08-17 NOTE — Telephone Encounter (Signed)
Refill done.  

## 2016-08-18 ENCOUNTER — Encounter: Payer: Self-pay | Admitting: Family Medicine

## 2016-08-18 NOTE — Telephone Encounter (Signed)
Sent her a my chart message to ask.  If she cannot have PCP do it I will but would like her to ask PCP first.

## 2016-08-21 ENCOUNTER — Telehealth: Payer: Self-pay | Admitting: Internal Medicine

## 2016-08-21 NOTE — Telephone Encounter (Signed)
LMTCB-need to confirm that patient needs Albuterol HFA inhaler.

## 2016-08-24 MED ORDER — ALBUTEROL SULFATE HFA 108 (90 BASE) MCG/ACT IN AERS: 2.0000 | INHALATION_SPRAY | Freq: Four times a day (QID) | RESPIRATORY_TRACT | 6 refills | Status: AC | PRN

## 2016-08-24 NOTE — Telephone Encounter (Signed)
Refill of proair has been sent to the pts pharmacy. I have called to make her aware of this. Nothing further is needed.

## 2016-09-02 NOTE — Assessment & Plan Note (Signed)
I do feel that this is some of the inflammation causing some of the symptoms. We discussed icing regimen and home exercises. We discussed objective is doing which ones to avoid. Patient will continue to be active in follow-up with me again in 4-6 weeks.

## 2016-09-02 NOTE — Progress Notes (Signed)
[name] [address] [place], [place] [address] Phone: [phone] Subjective:    I'm seeing this patient by the request  of:  ALICE Heiress Williamson WOOD, PA-C   CC: arthralgia f/u  [id number]  [name] [name] is a 45 y.o. female coming in with complaint of arthralgia. Patient's past medical history is significant for Sjogren syndrome.   Patient last exam was having some mild increasing pain laying then seemed to be more pain flare of her Sjogren's. Did decrease inflammation with natural supplementations. Discussed icing regimen and home exercises. Patient did decrease some activities. Patient states He continues to have pain overall does think she is making some mild improvement. States that she feels encourage [place] tightness overall and just the nerves in her entire body seemed to be overloaded and irritated. Recently has developed a cough. Been going on 2 weeks. Wondering if she can get any type of antibiotic.      Past Medical History:  Diagnosis Date  . Allergic rhinitis   . Cardiac murmur   . Chronic rhinosinusitis   . Cough   . HSV infection    History of-outbreak during pregnancy 2004  . Mono exposure    Had in January  . Mononucleosis 01/06/2012  . Thrush, oral   . Yeast infection of the vagina    Past Surgical History:  Procedure Laterality Date  . CESAREAN SECTION  2002,2004,2008, 2009  . COLONOSCOPY  12/25/2011   Dr. Oneida Alar: small internal hemorrhoids, adenomas, surveillance in 2018  . ESOPHAGOGASTRODUODENOSCOPY  Feb 2013   Dr. Oneida Alar: normal stomach, duodenum, esophagus. Gastritis. hiatal hernia  . ESOPHAGOGASTRODUODENOSCOPY N/A 02/11/2015   Procedure: ESOPHAGOGASTRODUODENOSCOPY (EGD);  Surgeon: Danie Binder, MD;  [facility];  Service: Endoscopy;  Laterality: N/A;  . FLEXIBLE SIGMOIDOSCOPY N/A 02/11/2015   Procedure: FLEXIBLE SIGMOIDOSCOPY;  Surgeon: Danie Binder, MD;  [facility];  Service: Endoscopy;   Laterality: N/A;  1045  . GIVENS CAPSULE STUDY N/A 03/20/2015   Procedure: GIVENS CAPSULE STUDY;  Surgeon: Danie Binder, MD;  [facility];  Service: Endoscopy;  Laterality: N/A;  0700  . HEMORRHOID BANDING N/A 02/11/2015   Procedure: HEMORRHOID BANDING;  Surgeon: Danie Binder, MD;  [facility];  Service: Endoscopy;  Laterality: N/A;  . NASAL SINUS SURGERY  2005   Social History   Social History  . Marital status: Married    Spouse name: N/A  . Number of children: 4  . Years of education: N/A   Occupational History  . Housewife-recent accounting degree. Unemployed   Social History Main Topics  . Smoking status: Never Smoker  . Smokeless tobacco: Never Used  . Alcohol use No  . Drug use: No  . Sexual activity: Yes   Other Topics Concern  . None   Social History Narrative  . None   Allergies  Allergen Reactions  . Sulfonamide Derivatives Anaphylaxis   Family History  Problem Relation Age of Onset  . Allergies Sister   . Allergies Brother   . Cervical cancer Mother   . Colon polyps Mother 97    several "benign" polyps  . Cancer Mother   . Breast cancer      grandmother  . Colon cancer Neg Hx   . Cancer Maternal Grandmother     Past medical history, social, surgical and family history all reviewed in electronic medical record.  No pertanent information unless stated regarding to the chief complaint.  Review of Systems: No visual changes, nausea, vomiting, diarrhea, constipation, dizziness, abdominal pain, skin rash, fevers, chills, night sweats,  , chest pain, shortness of breath, positive for joint pains, muscle soreness, muscle swelling, weight loss and weight gain that seems to fluctuate, headaches  Objective  Blood pressure 118/80, pulse 69, weight 119 lb (54 kg), SpO2 98 %.  General: No apparent distress alert and oriented x3 mood and affect normal, dressed appropriately.  HEENT: Pupils equal, extraocular movements intact    Respiratory: Patient's speak in full sentences and does not appear short of breath  Cardiovascular: No lower extremity edema, non tender, no erythema  Skin: Warm dry intact with no signs of infection or rash on extremities or on axial skeleton.  Abdomen: Soft nontender  Neuro: Cranial nerves II through XII are intact, neurovascularly intact in all extremities with 2+ DTRs and 2+ pulses.  Lymph: No lymphadenopathy of posterior or anterior cervical chain or axillae bilaterally.  Gait normal with good balance and coordination.  MSK:  Non tender with full range of motion and good stability and symmetric strength and tone of shoulders, elbows, wrist, hip, knee and ankles bilaterally. Mild discomfort diffusely mostly over the joints  Mild increasing tightness  Osteopathic findings C2 flexed rotated and side bent right C6 flexed rotated and side bent left T5 extended rotated and side bent left with inhaled third rib L2 flexed rotated and side bent right Sacrum left on left   Impression and Recommendations:     This case required medical decision making of moderate complexity.      Note: This dictation was prepared with Dragon dictation along with smaller phrase technology. Any transcriptional errors that result from this process are unintentional.

## 2016-09-03 ENCOUNTER — Ambulatory Visit (INDEPENDENT_AMBULATORY_CARE_PROVIDER_SITE_OTHER): Payer: Managed Care, Other (non HMO) | Admitting: Family Medicine

## 2016-09-03 ENCOUNTER — Encounter: Payer: Self-pay | Admitting: Family Medicine

## 2016-09-03 VITALS — BP 118/80 | HR 69 | Wt 119.0 lb

## 2016-09-03 DIAGNOSIS — Q8719 Other congenital malformation syndromes predominantly associated with short stature: Secondary | ICD-10-CM

## 2016-09-03 DIAGNOSIS — M999 Biomechanical lesion, unspecified: Secondary | ICD-10-CM | POA: Diagnosis not present

## 2016-09-03 DIAGNOSIS — Q871 Congenital malformation syndromes predominantly associated with short stature: Secondary | ICD-10-CM

## 2016-09-03 MED ORDER — VENLAFAXINE HCL ER 37.5 MG PO CP24: 37.5000 mg | ORAL_CAPSULE | Freq: Every day | ORAL | 1 refills | Status: DC

## 2016-09-03 MED ORDER — DOXYCYCLINE HYCLATE 100 MG PO TABS: 100.0000 mg | ORAL_TABLET | Freq: Two times a day (BID) | ORAL | 0 refills | Status: AC

## 2016-09-03 NOTE — Patient Instructions (Signed)
Good to se Arlan Organ is your friend  Stay active ! Effexor daily  Doxycycline if you think you need it.  See me again in 4 weeks.

## 2016-09-23 ENCOUNTER — Ambulatory Visit: Payer: Managed Care, Other (non HMO) | Admitting: Internal Medicine

## 2016-09-30 ENCOUNTER — Other Ambulatory Visit: Payer: Self-pay | Admitting: Family Medicine

## 2016-09-30 NOTE — Telephone Encounter (Signed)
Refill done.  

## 2016-09-30 NOTE — Progress Notes (Signed)
[name] [address], [place] [address] Phone: [phone] Subjective:    CC: arthralgia f/u  [id number]  [name] [name] is a 45 y.o. female coming in with complaint of arthralgia. Patient's past medical history is significant for Sjogren syndrome.   Patient has responded to osteopathic manipulation. Patient was last seen one month ago. Patient states She continues to have pain as well. She states that working out. Continues to have some discomfort. Patient has been doing vitamin D with some maybe mild improvement overall. Still having some fatigue at the end of the day. Patient states pain seems to be worsening more than any other joint. Denies any giving out on her, denies any nighttime awakening. Feels it is likely just the underlying Sjogren's syndrome. Has not seen a rheumatologist for quite some time and is not scheduled until February.      Past Medical History:  Diagnosis Date  . Allergic rhinitis   . Cardiac murmur   . Chronic rhinosinusitis   . Cough   . HSV infection    History of-outbreak during pregnancy 2004  . Mono exposure    Had in January  . Mononucleosis 01/06/2012  . Thrush, oral   . Yeast infection of the vagina    Past Surgical History:  Procedure Laterality Date  . CESAREAN SECTION  2002,2004,2008, 2009  . COLONOSCOPY  12/25/2011   Dr. Oneida Alar: small internal hemorrhoids, adenomas, surveillance in 2018  . ESOPHAGOGASTRODUODENOSCOPY  Feb 2013   Dr. Oneida Alar: normal stomach, duodenum, esophagus. Gastritis. hiatal hernia  . ESOPHAGOGASTRODUODENOSCOPY N/A 02/11/2015   Procedure: ESOPHAGOGASTRODUODENOSCOPY (EGD);  Surgeon: Danie Binder, MD;  Location: [facility];  Service: Endoscopy;  Laterality: N/A;  . FLEXIBLE SIGMOIDOSCOPY N/A 02/11/2015   Procedure: FLEXIBLE SIGMOIDOSCOPY;  Surgeon: Danie Binder, MD;  Location: [facility];  Service: Endoscopy;  Laterality: N/A;  1045  . GIVENS CAPSULE STUDY N/A 03/20/2015   Procedure: GIVENS CAPSULE STUDY;  Surgeon: Danie Binder, MD;  [facility]: [facility];  Service: Endoscopy;  Laterality: N/A;  0700  . HEMORRHOID BANDING N/A 02/11/2015   Procedure: HEMORRHOID BANDING;  Surgeon: Danie Binder, MD;  [facility]: [facility];  Service: Endoscopy;  Laterality: N/A;  . NASAL SINUS SURGERY  2005   Social History   Social History  . Marital status: Married    Spouse name: N/A  . Number of children: 4  . Years of education: N/A   Occupational History  . Housewife-recent accounting degree. Unemployed   Social History Main Topics  . Smoking status: Never Smoker  . Smokeless tobacco: Never Used  . Alcohol use No  . Drug use: No  . Sexual activity: Yes   Other Topics Concern  . Not on file   Social History Narrative  . No narrative on file   Allergies  Allergen Reactions  . Sulfonamide Derivatives Anaphylaxis   Family History  Problem Relation Age of Onset  . Allergies Sister   . Allergies Brother   . Cervical cancer Mother   . Colon polyps Mother 59    several "benign" polyps  . Cancer Mother   . Breast cancer      grandmother  . Colon cancer Neg Hx   . Cancer Maternal Grandmother     Past medical history, social, surgical and family history all reviewed in electronic medical record.  No pertanent information unless stated regarding to the chief complaint.   Review of Systems: No visual  changes, nausea, vomiting, diarrhea, constipation, dizziness, abdominal pain, skin rash, fevers, chills, night sweats,  , chest pain, shortness of breath, positive for joint pains, muscle soreness, muscle swelling, weight loss and weight gain that seems to fluctuate, headaches  Objective  There were no vitals taken for this visit.  Systems examined below as of 10/01/16 General: NAD A&O x3 mood, affect normal  HEENT: Pupils equal, extraocular movements intact no nystagmus Respiratory: not short of breath at rest or with speaking Cardiovascular: No lower  extremity edema, non tender Skin: Warm dry intact with no signs of infection or rash on extremities or on axial skeleton. Abdomen: Soft nontender, no masses Neuro: Cranial nerves  intact, neurovascularly intact in all extremities with 2+ DTRs and 2+ pulses. Lymph: No lymphadenopathy appreciated today  Gait normal with good balance and coordination.  MSK: Non tender with full range of motion and good stability and symmetric strength and tone of shoulders, elbows, wrist,  knee hips and ankles bilaterally.  Gait normal with good balance and coordination.  MSK:  Non tender with full range of motion and good stability and symmetric strength and tone of shoulders, elbows, wrist, hip, knee and ankles bilaterally. Continues to have signs of polyarthralgia  Osteopathic findings C2 flexed rotated and side bent right C6 flexed rotated and side bent left T5 extended rotated and side bent left with inhaled fifth rib T9 extended rotated and side bent right L2 flexed rotated and side bent right Sacrum left on left   Impression and Recommendations:     This case required medical decision making of moderate complexity.      Note: This dictation was prepared with Dragon dictation along with smaller phrase technology. Any transcriptional errors that result from this process are unintentional.

## 2016-10-01 ENCOUNTER — Ambulatory Visit (INDEPENDENT_AMBULATORY_CARE_PROVIDER_SITE_OTHER): Payer: Managed Care, Other (non HMO) | Admitting: Family Medicine

## 2016-10-01 ENCOUNTER — Encounter: Payer: Self-pay | Admitting: Family Medicine

## 2016-10-01 VITALS — BP 130/80 | HR 87 | Ht 60.0 in | Wt 120.0 lb

## 2016-10-01 DIAGNOSIS — M999 Biomechanical lesion, unspecified: Secondary | ICD-10-CM

## 2016-10-01 DIAGNOSIS — Q871 Congenital malformation syndromes predominantly associated with short stature: Secondary | ICD-10-CM

## 2016-10-01 DIAGNOSIS — M25561 Pain in right knee: Secondary | ICD-10-CM | POA: Diagnosis not present

## 2016-10-01 DIAGNOSIS — M25562 Pain in left knee: Secondary | ICD-10-CM | POA: Diagnosis not present

## 2016-10-01 DIAGNOSIS — Q8719 Other congenital malformation syndromes predominantly associated with short stature: Secondary | ICD-10-CM

## 2016-10-01 MED ORDER — DULOXETINE HCL 20 MG PO CPEP: 20.0000 mg | ORAL_CAPSULE | Freq: Every day | ORAL | 1 refills | Status: DC

## 2016-10-01 NOTE — Assessment & Plan Note (Signed)
Patient and very poor squatting technique. We try to fix that. Patellofemoral syndrome noted. Referred to physical therapy

## 2016-10-01 NOTE — Assessment & Plan Note (Signed)
Decision today to treat with OMT was based on Physical Exam  After verbal consent patient was treated with HVLA, ME,  techniques in thoracic, lumbar and sacral areas  Patient tolerated the procedure well with improvement in symptoms  Patient given exercises, stretches and lifestyle modifications  See medications in patient instructions if given  Patient will follow up in 4 weeks

## 2016-10-01 NOTE — Assessment & Plan Note (Signed)
Likely continuing to control treatment to the pain. We discussed continuing the vitamin D as well as a aunt supplementation. We discussed core strengthening, we discussed diet changes. Patient will continue to be active. Follow-up with me again in 4 weeks.

## 2016-10-01 NOTE — Patient Instructions (Addendum)
Goo to see you  Stop the effexor Start cymbalta 20 mg daily.  COuld help with the fatigue as well.  Meloxicam daily for 3 days when in pain then stop Try to work on posture  O halloren will call you as well.  See me again in 3 weeks.

## 2016-10-03 ENCOUNTER — Other Ambulatory Visit: Payer: Self-pay | Admitting: Family Medicine

## 2016-10-05 NOTE — Telephone Encounter (Signed)
Refill done.  

## 2016-10-09 ENCOUNTER — Other Ambulatory Visit: Payer: Self-pay | Admitting: Family Medicine

## 2016-10-22 ENCOUNTER — Ambulatory Visit: Payer: Managed Care, Other (non HMO) | Admitting: Family Medicine

## 2016-10-31 ENCOUNTER — Other Ambulatory Visit: Payer: Self-pay | Admitting: Family Medicine

## 2016-10-31 NOTE — Progress Notes (Deleted)
[name] [address] [place], [place] [address] Phone: [phone] Subjective:    CC: arthralgia f/u  [id number]  [name] [name] is a 45 y.o. female coming in with complaint of arthralgia. Patient's past medical history is significant for Sjogren syndrome.   Patient has responded to osteopathic manipulation.  Patient at last exam was having worsening pain in the knees as well as some of her increasing discomfort in the back. Patient states      Past Medical History:  Diagnosis Date  . Allergic rhinitis   . Cardiac murmur   . Chronic rhinosinusitis   . Cough   . HSV infection    History of-outbreak during pregnancy 2004  . Mono exposure    Had in January  . Mononucleosis 01/06/2012  . Thrush, oral   . Yeast infection of the vagina    Past Surgical History:  Procedure Laterality Date  . CESAREAN SECTION  2002,2004,2008, 2009  . COLONOSCOPY  12/25/2011   Dr. Oneida Alar: small internal hemorrhoids, adenomas, surveillance in 2018  . ESOPHAGOGASTRODUODENOSCOPY  Feb 2013   Dr. Oneida Alar: normal stomach, duodenum, esophagus. Gastritis. hiatal hernia  . ESOPHAGOGASTRODUODENOSCOPY N/A 02/11/2015   Procedure: ESOPHAGOGASTRODUODENOSCOPY (EGD);  Surgeon: Danie Binder, MD;  [facility]: [facility];  Service: Endoscopy;  Laterality: N/A;  . FLEXIBLE SIGMOIDOSCOPY N/A 02/11/2015   Procedure: FLEXIBLE SIGMOIDOSCOPY;  Surgeon: Danie Binder, MD;  [facility];  Service: Endoscopy;  Laterality: N/A;  1045  . GIVENS CAPSULE STUDY N/A 03/20/2015   Procedure: GIVENS CAPSULE STUDY;  Surgeon: Danie Binder, MD;  [facility];  Service: Endoscopy;  Laterality: N/A;  0700  . HEMORRHOID BANDING N/A 02/11/2015   Procedure: HEMORRHOID BANDING;  Surgeon: Danie Binder, MD;  [facility];  Service: Endoscopy;  Laterality: N/A;  . NASAL SINUS SURGERY  2005   Social History   Social History  . Marital status: Married    Spouse name: N/A    . Number of children: 4  . Years of education: N/A   Occupational History  . Housewife-recent accounting degree. Unemployed   Social History Main Topics  . Smoking status: Never Smoker  . Smokeless tobacco: Never Used  . Alcohol use No  . Drug use: No  . Sexual activity: Yes   Other Topics Concern  . Not on file   Social History Narrative  . No narrative on file   Allergies  Allergen Reactions  . Sulfonamide Derivatives Anaphylaxis   Family History  Problem Relation Age of Onset  . Allergies Sister   . Allergies Brother   . Cervical cancer Mother   . Colon polyps Mother 51    several "benign" polyps  . Cancer Mother   . Breast cancer      grandmother  . Colon cancer Neg Hx   . Cancer Maternal Grandmother     Past medical history, social, surgical and family history all reviewed in electronic medical record.  No pertanent information unless stated regarding to the chief complaint.   Review of Systems: No visual changes, nausea, vomiting, diarrhea, constipation, dizziness, abdominal pain, skin rash, fevers, chills, night sweats,  , chest pain, shortness of breath, positive for joint pains, muscle soreness, muscle swelling, weight loss and weight gain that seems to fluctuate, headaches  Objective  There were no vitals taken for this visit.  Systems examined below as of 10/31/16 General: NAD A&O x3 mood, affect normal  HEENT: Pupils  equal, extraocular movements intact no nystagmus Respiratory: not short of breath at rest or with speaking Cardiovascular: No lower extremity edema, non tender Skin: Warm dry intact with no signs of infection or rash on extremities or on axial skeleton. Abdomen: Soft nontender, no masses Neuro: Cranial nerves  intact, neurovascularly intact in all extremities with 2+ DTRs and 2+ pulses. Lymph: No lymphadenopathy appreciated today  Gait normal with good balance and coordination.  MSK: Non tender with full range of motion and good  stability and symmetric strength and tone of shoulders, elbows, wrist,  knee hips and ankles bilaterally.  Gait normal with good balance and coordination.  MSK:  Non tender with full range of motion and good stability and symmetric strength and tone of shoulders, elbows, wrist, hip, knee and ankles bilaterally. Continues to have signs of polyarthralgia  Osteopathic findings C2 flexed rotated and side bent right C6 flexed rotated and side bent left T5 extended rotated and side bent left with inhaled fifth rib T9 extended rotated and side bent right L2 flexed rotated and side bent right Sacrum left on left   Impression and Recommendations:     This case required medical decision making of moderate complexity.      Note: This dictation was prepared with Dragon dictation along with smaller phrase technology. Any transcriptional errors that result from this process are unintentional.

## 2016-11-02 ENCOUNTER — Ambulatory Visit: Payer: Managed Care, Other (non HMO) | Admitting: Family Medicine

## 2016-11-02 NOTE — Telephone Encounter (Signed)
Refill done.  

## 2016-11-04 ENCOUNTER — Ambulatory Visit: Payer: Managed Care, Other (non HMO) | Admitting: Internal Medicine

## 2016-11-11 ENCOUNTER — Encounter: Payer: Self-pay | Admitting: Gastroenterology

## 2016-11-29 ENCOUNTER — Other Ambulatory Visit: Payer: Self-pay | Admitting: Family Medicine

## 2016-11-30 NOTE — Telephone Encounter (Signed)
Refill done.  

## 2016-12-19 ENCOUNTER — Other Ambulatory Visit: Payer: Self-pay | Admitting: Family Medicine

## 2017-08-12 ENCOUNTER — Telehealth: Payer: Self-pay | Admitting: Internal Medicine

## 2017-08-12 NOTE — Telephone Encounter (Signed)
Called and spoke with pt and she is aware of her last PNA vaccine.  Nothing further is needed.

## 2017-10-29 ENCOUNTER — Other Ambulatory Visit: Payer: Self-pay | Admitting: Family Medicine
